# Patient Record
Sex: Male | Born: 1945 | Race: Black or African American | Hispanic: No | Marital: Married | Smoking: Former smoker
Health system: Southern US, Community
[De-identification: ages and names within clinical notes are randomized; demographics above are authoritative.]

## PROBLEM LIST (undated history)

## (undated) DIAGNOSIS — M1990 Unspecified osteoarthritis, unspecified site: Secondary | ICD-10-CM

## (undated) DIAGNOSIS — E119 Type 2 diabetes mellitus without complications: Secondary | ICD-10-CM

## (undated) DIAGNOSIS — I10 Essential (primary) hypertension: Secondary | ICD-10-CM

## (undated) DIAGNOSIS — C61 Malignant neoplasm of prostate: Secondary | ICD-10-CM

## (undated) DIAGNOSIS — G5601 Carpal tunnel syndrome, right upper limb: Secondary | ICD-10-CM

## (undated) HISTORY — PX: INCISION AND DRAINAGE DEEP NECK ABSCESS: SHX1797

---

## 2000-08-08 ENCOUNTER — Encounter: Payer: Self-pay | Admitting: Endocrinology

## 2000-08-08 ENCOUNTER — Encounter: Admission: RE | Admit: 2000-08-08 | Discharge: 2000-08-08 | Payer: Self-pay | Admitting: Endocrinology

## 2003-05-22 ENCOUNTER — Ambulatory Visit (HOSPITAL_COMMUNITY): Admission: RE | Admit: 2003-05-22 | Discharge: 2003-05-22 | Payer: Self-pay | Admitting: Internal Medicine

## 2003-05-22 ENCOUNTER — Encounter: Payer: Self-pay | Admitting: Internal Medicine

## 2005-04-20 ENCOUNTER — Emergency Department (HOSPITAL_COMMUNITY): Admission: EM | Admit: 2005-04-20 | Discharge: 2005-04-20 | Payer: Self-pay | Admitting: Family Medicine

## 2005-10-24 DIAGNOSIS — C61 Malignant neoplasm of prostate: Secondary | ICD-10-CM

## 2005-10-24 HISTORY — PX: INSERTION PROSTATE RADIATION SEED: SUR718

## 2005-10-24 HISTORY — DX: Malignant neoplasm of prostate: C61

## 2009-02-26 ENCOUNTER — Encounter: Payer: Self-pay | Admitting: Internal Medicine

## 2009-03-04 ENCOUNTER — Encounter: Payer: Self-pay | Admitting: Internal Medicine

## 2009-03-10 ENCOUNTER — Ambulatory Visit: Payer: Self-pay | Admitting: Internal Medicine

## 2009-03-10 DIAGNOSIS — I10 Essential (primary) hypertension: Secondary | ICD-10-CM | POA: Insufficient documentation

## 2009-03-10 DIAGNOSIS — E785 Hyperlipidemia, unspecified: Secondary | ICD-10-CM | POA: Insufficient documentation

## 2009-03-10 DIAGNOSIS — R0989 Other specified symptoms and signs involving the circulatory and respiratory systems: Secondary | ICD-10-CM

## 2009-03-10 DIAGNOSIS — J984 Other disorders of lung: Secondary | ICD-10-CM

## 2009-03-10 DIAGNOSIS — R0609 Other forms of dyspnea: Secondary | ICD-10-CM

## 2009-03-18 ENCOUNTER — Ambulatory Visit (HOSPITAL_COMMUNITY): Admission: RE | Admit: 2009-03-18 | Discharge: 2009-03-18 | Payer: Self-pay | Admitting: Internal Medicine

## 2009-03-18 ENCOUNTER — Ambulatory Visit: Payer: Self-pay | Admitting: Internal Medicine

## 2009-03-24 ENCOUNTER — Telehealth: Payer: Self-pay | Admitting: Internal Medicine

## 2009-03-24 DIAGNOSIS — J387 Other diseases of larynx: Secondary | ICD-10-CM

## 2009-03-25 ENCOUNTER — Encounter: Payer: Self-pay | Admitting: Internal Medicine

## 2009-03-25 ENCOUNTER — Telehealth: Payer: Self-pay | Admitting: Internal Medicine

## 2009-05-08 ENCOUNTER — Encounter: Payer: Self-pay | Admitting: Internal Medicine

## 2010-06-08 IMAGING — PT NM PET TUM IMG INITIAL (PI) SKULL BASE T - THIGH
1 of 7 series · 1 of 25 positions shown · non-contrast
Comparison: None

CLINICAL DATA: Initial treatment strategy for lung cancer.

NUCLEAR MEDICINE PET SKULL BASE TO THIGH
TECHNIQUE: 16.8 mCi F-18 FDG was injected intravenously via the
right hand.  Full-ring PET imaging was performed from the skull
base through the mid-thighs 110  minutes after injection.  CT data
was obtained and used for attenuation correction and anatomic
localization only.  (This was not acquired as a diagnostic CT
examination.)

[Series 2: ct images · axial · 3.8mm · 0.98mm/px · 1 of 267 slices shown]
[im 267/267  brain]
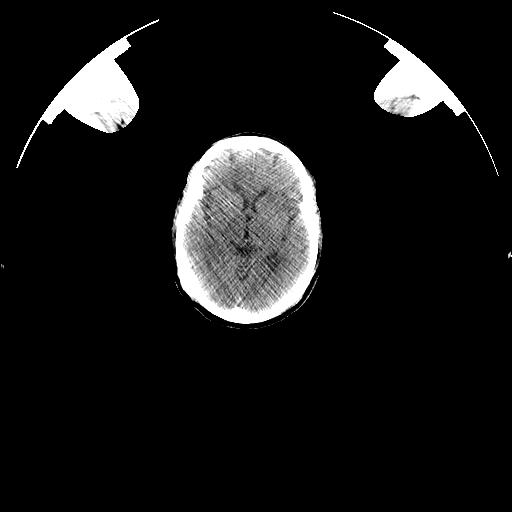

[1 of 25 positions shown; findings below may reference images not displayed]

FINDINGS: Neck: There there is symmetric hypermetabolic activity in the
posterior nasopharynx.  There is hypermetabolic activity in the
pharyngeal mucosa.  There is asymmetry in the left vallecular
region (image 47).  There is a single hypermetabolic 1.5 cm left
level II A node (image 33) with S U V max with SUV max = 7.8.

Chest: The 2  cm left lower lobe pulmonary mass has no associated
hypermetabolic activity consistent with benign etiology.  Within
the right lower lobe, there is a 4 mm nodule (image 106).  Airway
appears normal.  No evidence of hypermetabolic mediastinal nodes.

Abdomen / Pelvis: There is hypermetabolic activity associated with
left and right adrenal gland with SUV max = 3.7).  This activity is
only slightly above liver activity.  There is no adrenal lesion
identified on the CT portion.

No hypermetabolic nodes in the abdomen pelvis.

Skeleton: No focal hypermetabolic activity to suggest skeletal
metastasis.
IMPRESSION: 1.  No hypermetabolic activity associated with 2 cm round left
lower lobe pulmonary nodule consistent with  benign etiology.
2. Hypermetabolic left level II A node with asymmetry in the left
vallecular region.  Recommend ENT direct visualization of the
oropharynx/hypopharynx  to exclude head neck cancer.

3.  A 4 mm right lower lobe pulmonary nodule. If the patient is low
risk for carcinoma recommend follow-up noncontrast CT in 12
months. If high risk recommend follow-up in 6 to 12 months per
Fleischner criteria.

4.  Hypermetabolic adrenal glands likely represents hyperplasia.

This recommendation follows the consensus statement: "Guidelines
for Management of Small Pulmonary Nodules Detected on CT Scans:  A
Statement from the [HOSPITAL]" as published in Radiology
8887; [DATE].  Available online at:
[URL]

## 2010-11-15 ENCOUNTER — Encounter: Payer: Self-pay | Admitting: Otolaryngology

## 2010-11-15 ENCOUNTER — Encounter: Payer: Self-pay | Admitting: General Practice

## 2011-01-10 ENCOUNTER — Telehealth: Payer: Self-pay | Admitting: Internal Medicine

## 2011-01-20 NOTE — Progress Notes (Signed)
Summary: never bought his old xrays  ---- Converted from flag ---- ---- 05/14/2009 4:45 PM, Carron Curie CMA wrote:  I advised him of the need to make an appt and bring Xrays and he stated he will call to set that up and bring xrays at that time. As of today he has done neither.  ---- 05/14/2009 4:35 PM, Kalman Shan MD wrote: did he ever bring his old xrarys. when his next appt? ------------------------------

## 2011-01-20 NOTE — Progress Notes (Signed)
Summary: old xrays  ---- Converted from flag ---- ---- 04/01/2009 3:01 PM, Kalman Shan MD wrote: he has appt with me on 7/19. he needs to bring in old xrays with Dr. Lucianne Muss for my personal review if he haas not already done it ------------------------------

## 2011-02-01 LAB — GLUCOSE, CAPILLARY: Glucose-Capillary: 66 mg/dL — ABNORMAL LOW (ref 70–99)

## 2015-07-23 ENCOUNTER — Other Ambulatory Visit: Payer: Self-pay | Admitting: Orthopedic Surgery

## 2015-08-12 ENCOUNTER — Encounter (HOSPITAL_BASED_OUTPATIENT_CLINIC_OR_DEPARTMENT_OTHER): Payer: Self-pay | Admitting: *Deleted

## 2015-08-14 ENCOUNTER — Encounter (HOSPITAL_BASED_OUTPATIENT_CLINIC_OR_DEPARTMENT_OTHER)
Admission: RE | Admit: 2015-08-14 | Discharge: 2015-08-14 | Disposition: A | Discharge status: Home / Self Care | Payer: Self-pay | Source: Ambulatory Visit | Attending: Orthopedic Surgery | Admitting: Orthopedic Surgery

## 2015-08-14 ENCOUNTER — Other Ambulatory Visit: Payer: Self-pay

## 2015-08-14 DIAGNOSIS — G5601 Carpal tunnel syndrome, right upper limb: Secondary | ICD-10-CM | POA: Diagnosis present

## 2015-08-14 DIAGNOSIS — Z7984 Long term (current) use of oral hypoglycemic drugs: Secondary | ICD-10-CM | POA: Diagnosis not present

## 2015-08-14 DIAGNOSIS — E119 Type 2 diabetes mellitus without complications: Secondary | ICD-10-CM | POA: Diagnosis not present

## 2015-08-14 DIAGNOSIS — Z8546 Personal history of malignant neoplasm of prostate: Secondary | ICD-10-CM | POA: Diagnosis not present

## 2015-08-14 DIAGNOSIS — G5621 Lesion of ulnar nerve, right upper limb: Secondary | ICD-10-CM | POA: Diagnosis not present

## 2015-08-14 DIAGNOSIS — Z87891 Personal history of nicotine dependence: Secondary | ICD-10-CM | POA: Diagnosis not present

## 2015-08-14 DIAGNOSIS — I10 Essential (primary) hypertension: Secondary | ICD-10-CM | POA: Diagnosis not present

## 2015-08-14 LAB — BASIC METABOLIC PANEL
ANION GAP: 10 (ref 5–15)
BUN: 23 mg/dL — AB (ref 6–20)
CALCIUM: 9.7 mg/dL (ref 8.9–10.3)
CO2: 21 mmol/L — AB (ref 22–32)
CREATININE: 2.53 mg/dL — AB (ref 0.61–1.24)
Chloride: 111 mmol/L (ref 101–111)
GFR calc Af Amer: 28 mL/min — ABNORMAL LOW (ref >60)
GFR, EST NON AFRICAN AMERICAN: 24 mL/min — AB (ref >60)
GLUCOSE: 137 mg/dL — AB (ref 65–99)
Potassium: 5.6 mmol/L — ABNORMAL HIGH (ref 3.5–5.1)
Sodium: 142 mmol/L (ref 135–145)

## 2015-08-14 NOTE — Progress Notes (Signed)
BMET results shown to Dr Linna Caprice, repeat [facility].

## 2015-08-18 ENCOUNTER — Ambulatory Visit (HOSPITAL_BASED_OUTPATIENT_CLINIC_OR_DEPARTMENT_OTHER): Payer: Medicare Other | Admitting: Certified Registered"

## 2015-08-18 ENCOUNTER — Encounter (HOSPITAL_BASED_OUTPATIENT_CLINIC_OR_DEPARTMENT_OTHER): Payer: Self-pay | Admitting: Orthopedic Surgery

## 2015-08-18 ENCOUNTER — Encounter (HOSPITAL_BASED_OUTPATIENT_CLINIC_OR_DEPARTMENT_OTHER)
Admission: RE | Disposition: A | Discharge status: Home / Self Care | Payer: Self-pay | Source: Ambulatory Visit | Attending: Orthopedic Surgery

## 2015-08-18 ENCOUNTER — Ambulatory Visit (HOSPITAL_BASED_OUTPATIENT_CLINIC_OR_DEPARTMENT_OTHER)
Admission: RE | Admit: 2015-08-18 | Discharge: 2015-08-18 | Disposition: A | Discharge status: Home / Self Care | Payer: Medicare Other | Source: Ambulatory Visit | Attending: Orthopedic Surgery | Admitting: Orthopedic Surgery

## 2015-08-18 DIAGNOSIS — Z87891 Personal history of nicotine dependence: Secondary | ICD-10-CM | POA: Insufficient documentation

## 2015-08-18 DIAGNOSIS — I10 Essential (primary) hypertension: Secondary | ICD-10-CM | POA: Insufficient documentation

## 2015-08-18 DIAGNOSIS — Z7984 Long term (current) use of oral hypoglycemic drugs: Secondary | ICD-10-CM | POA: Insufficient documentation

## 2015-08-18 DIAGNOSIS — G5601 Carpal tunnel syndrome, right upper limb: Secondary | ICD-10-CM | POA: Insufficient documentation

## 2015-08-18 DIAGNOSIS — E119 Type 2 diabetes mellitus without complications: Secondary | ICD-10-CM | POA: Insufficient documentation

## 2015-08-18 DIAGNOSIS — G5621 Lesion of ulnar nerve, right upper limb: Secondary | ICD-10-CM | POA: Insufficient documentation

## 2015-08-18 DIAGNOSIS — Z8546 Personal history of malignant neoplasm of prostate: Secondary | ICD-10-CM | POA: Insufficient documentation

## 2015-08-18 HISTORY — DX: Unspecified osteoarthritis, unspecified site: M19.90

## 2015-08-18 HISTORY — PX: ULNAR NERVE TRANSPOSITION: SHX2595

## 2015-08-18 HISTORY — DX: Type 2 diabetes mellitus without complications: E11.9

## 2015-08-18 HISTORY — DX: Carpal tunnel syndrome, right upper limb: G56.01

## 2015-08-18 HISTORY — PX: CARPAL TUNNEL RELEASE: SHX101

## 2015-08-18 HISTORY — DX: Malignant neoplasm of prostate: C61

## 2015-08-18 HISTORY — DX: Essential (primary) hypertension: I10

## 2015-08-18 LAB — POCT I-STAT, CHEM 8
BUN: 34 mg/dL — ABNORMAL HIGH (ref 6–20)
CALCIUM ION: 1.23 mmol/L (ref 1.13–1.30)
CHLORIDE: 113 mmol/L — AB (ref 101–111)
Creatinine, Ser: 2.4 mg/dL — ABNORMAL HIGH (ref 0.61–1.24)
GLUCOSE: 146 mg/dL — AB (ref 65–99)
HCT: 33 % — ABNORMAL LOW (ref 39.0–52.0)
HEMOGLOBIN: 11.2 g/dL — AB (ref 13.0–17.0)
Potassium: 4.9 mmol/L (ref 3.5–5.1)
SODIUM: 141 mmol/L (ref 135–145)
TCO2: 19 mmol/L (ref 0–100)

## 2015-08-18 LAB — GLUCOSE, CAPILLARY: GLUCOSE-CAPILLARY: 133 mg/dL — AB (ref 65–99)

## 2015-08-18 SURGERY — CARPAL TUNNEL RELEASE
Anesthesia: Regional | Site: Wrist | Laterality: Right

## 2015-08-18 MED ORDER — PROPOFOL 500 MG/50ML IV EMUL
INTRAVENOUS | Status: DC | PRN
  Administered 2015-08-18: 35 ug/kg/min via INTRAVENOUS

## 2015-08-18 MED ORDER — GLYCOPYRROLATE 0.2 MG/ML IJ SOLN: 0.2000 mg | Freq: Once | INTRAMUSCULAR | Status: DC | PRN

## 2015-08-18 MED ORDER — LIDOCAINE HCL (CARDIAC) 20 MG/ML IV SOLN
INTRAVENOUS | Status: AC
  Filled 2015-08-18: qty 5

## 2015-08-18 MED ORDER — FENTANYL CITRATE (PF) 100 MCG/2ML IJ SOLN
50.0000 ug | INTRAMUSCULAR | Status: DC | PRN
  Administered 2015-08-18: 100 ug via INTRAVENOUS

## 2015-08-18 MED ORDER — BUPIVACAINE-EPINEPHRINE (PF) 0.5% -1:200000 IJ SOLN
INTRAMUSCULAR | Status: DC | PRN
  Administered 2015-08-18: 30 mL via PERINEURAL

## 2015-08-18 MED ORDER — MIDAZOLAM HCL 2 MG/2ML IJ SOLN
INTRAMUSCULAR | Status: AC
  Filled 2015-08-18: qty 2

## 2015-08-18 MED ORDER — LACTATED RINGERS IV SOLN
INTRAVENOUS | Status: DC
  Administered 2015-08-18: 09:00:00 via INTRAVENOUS

## 2015-08-18 MED ORDER — SCOPOLAMINE 1 MG/3DAYS TD PT72: 1.0000 | MEDICATED_PATCH | Freq: Once | TRANSDERMAL | Status: DC | PRN

## 2015-08-18 MED ORDER — ONDANSETRON HCL 4 MG/2ML IJ SOLN: 4.0000 mg | Freq: Once | INTRAMUSCULAR | Status: DC | PRN

## 2015-08-18 MED ORDER — CEFAZOLIN SODIUM-DEXTROSE 2-3 GM-% IV SOLR: 2.0000 g | INTRAVENOUS | Status: DC

## 2015-08-18 MED ORDER — ONDANSETRON HCL 4 MG/2ML IJ SOLN
INTRAMUSCULAR | Status: DC | PRN
  Administered 2015-08-18: 4 mg via INTRAVENOUS
  Administered 2015-08-18: 20 mg via INTRAVENOUS

## 2015-08-18 MED ORDER — MEPERIDINE HCL 25 MG/ML IJ SOLN: 6.2500 mg | INTRAMUSCULAR | Status: DC | PRN

## 2015-08-18 MED ORDER — FENTANYL CITRATE (PF) 100 MCG/2ML IJ SOLN
INTRAMUSCULAR | Status: AC
  Filled 2015-08-18: qty 2

## 2015-08-18 MED ORDER — ONDANSETRON HCL 4 MG/2ML IJ SOLN
INTRAMUSCULAR | Status: AC
  Filled 2015-08-18: qty 2

## 2015-08-18 MED ORDER — MIDAZOLAM HCL 2 MG/2ML IJ SOLN
1.0000 mg | INTRAMUSCULAR | Status: DC | PRN
  Administered 2015-08-18: 2 mg via INTRAVENOUS

## 2015-08-18 MED ORDER — HYDROCODONE-ACETAMINOPHEN 10-325 MG PO TABS: 1.0000 | ORAL_TABLET | Freq: Four times a day (QID) | ORAL | Status: DC | PRN

## 2015-08-18 MED ORDER — HYDROMORPHONE HCL 1 MG/ML IJ SOLN: 0.2500 mg | INTRAMUSCULAR | Status: DC | PRN

## 2015-08-18 MED ORDER — CHLORHEXIDINE GLUCONATE 4 % EX LIQD: 60.0000 mL | Freq: Once | CUTANEOUS | Status: DC

## 2015-08-18 MED ORDER — CEFAZOLIN SODIUM-DEXTROSE 2-3 GM-% IV SOLR
2.0000 g | INTRAVENOUS | Status: AC
  Administered 2015-08-18: 2 g via INTRAVENOUS

## 2015-08-18 MED ORDER — CEFAZOLIN SODIUM-DEXTROSE 2-3 GM-% IV SOLR
INTRAVENOUS | Status: AC
  Filled 2015-08-18: qty 50

## 2015-08-18 SURGICAL SUPPLY — 48 items
BLADE MINI RND TIP GREEN BEAV (BLADE) IMPLANT
BLADE SURG 15 STRL LF DISP TIS (BLADE) ×2 IMPLANT
BLADE SURG 15 STRL SS (BLADE) ×4
BNDG CMPR 9X4 STRL LF SNTH (GAUZE/BANDAGES/DRESSINGS) ×2
BNDG COHESIVE 3X5 TAN STRL LF (GAUZE/BANDAGES/DRESSINGS) ×8 IMPLANT
BNDG ESMARK 4X9 LF (GAUZE/BANDAGES/DRESSINGS) ×4 IMPLANT
BNDG GAUZE ELAST 4 BULKY (GAUZE/BANDAGES/DRESSINGS) ×4 IMPLANT
CHLORAPREP W/TINT 26ML (MISCELLANEOUS) ×4 IMPLANT
CORDS BIPOLAR (ELECTRODE) ×4 IMPLANT
COVER BACK TABLE 60X90IN (DRAPES) ×4 IMPLANT
COVER MAYO STAND STRL (DRAPES) ×4 IMPLANT
CUFF TOURN SGL LL 18 NRW (TOURNIQUET CUFF) ×4 IMPLANT
CUFF TOURNIQUET SINGLE 18IN (TOURNIQUET CUFF) ×4 IMPLANT
DECANTER SPIKE VIAL GLASS SM (MISCELLANEOUS) IMPLANT
DRAPE EXTREMITY T 121X128X90 (DRAPE) ×4 IMPLANT
DRAPE SURG 17X23 STRL (DRAPES) ×4 IMPLANT
DRSG PAD ABDOMINAL 8X10 ST (GAUZE/BANDAGES/DRESSINGS) ×4 IMPLANT
GAUZE SPONGE 4X4 12PLY STRL (GAUZE/BANDAGES/DRESSINGS) ×4 IMPLANT
GAUZE SPONGE 4X4 16PLY XRAY LF (GAUZE/BANDAGES/DRESSINGS) IMPLANT
GAUZE XEROFORM 1X8 LF (GAUZE/BANDAGES/DRESSINGS) ×4 IMPLANT
GLOVE BIOGEL PI IND STRL 8.5 (GLOVE) ×2 IMPLANT
GLOVE BIOGEL PI INDICATOR 8.5 (GLOVE) ×2
GLOVE SURG ORTHO 8.0 STRL STRW (GLOVE) ×4 IMPLANT
GOWN STRL REUS W/ TWL LRG LVL3 (GOWN DISPOSABLE) ×2 IMPLANT
GOWN STRL REUS W/TWL LRG LVL3 (GOWN DISPOSABLE) ×4
GOWN STRL REUS W/TWL XL LVL3 (GOWN DISPOSABLE) ×4 IMPLANT
LOOP VESSEL MAXI BLUE (MISCELLANEOUS) IMPLANT
NDL PRECISIONGLIDE 27X1.5 (NEEDLE) IMPLANT
NEEDLE PRECISIONGLIDE 27X1.5 (NEEDLE) IMPLANT
NS IRRIG 1000ML POUR BTL (IV SOLUTION) ×4 IMPLANT
PACK BASIN DAY SURGERY FS (CUSTOM PROCEDURE TRAY) ×4 IMPLANT
PAD CAST 3X4 CTTN HI CHSV (CAST SUPPLIES) IMPLANT
PAD CAST 4YDX4 CTTN HI CHSV (CAST SUPPLIES) ×2 IMPLANT
PADDING CAST COTTON 3X4 STRL (CAST SUPPLIES)
PADDING CAST COTTON 4X4 STRL (CAST SUPPLIES) ×4
SLEEVE SCD COMPRESS KNEE MED (MISCELLANEOUS) ×4 IMPLANT
SPLINT PLASTER CAST XFAST 3X15 (CAST SUPPLIES) IMPLANT
SPLINT PLASTER XTRA FASTSET 3X (CAST SUPPLIES)
STOCKINETTE 4X48 STRL (DRAPES) ×4 IMPLANT
SUT ETHILON 4 0 PS 2 18 (SUTURE) ×4 IMPLANT
SUT NYLON 9 0 VRM6 (SUTURE) ×2 IMPLANT
SUT VIC AB 2-0 SH 27 (SUTURE) ×4
SUT VIC AB 2-0 SH 27XBRD (SUTURE) ×2 IMPLANT
SUT VICRYL 4-0 PS2 18IN ABS (SUTURE) ×4 IMPLANT
SYR BULB 3OZ (MISCELLANEOUS) ×4 IMPLANT
SYR CONTROL 10ML LL (SYRINGE) IMPLANT
TOWEL OR 17X24 6PK STRL BLUE (TOWEL DISPOSABLE) ×4 IMPLANT
UNDERPAD 30X30 (UNDERPADS AND DIAPERS) ×4 IMPLANT

## 2015-08-18 NOTE — Discharge Instructions (Addendum)

## 2015-08-18 NOTE — Progress Notes (Signed)
Assisted Dr. Ossey with right, ultrasound guided, interscalene  block. Side rails up, monitors on throughout procedure. See vital signs in flow sheet. Tolerated Procedure well. 

## 2015-08-18 NOTE — Brief Op Note (Signed)
08/18/2015  11:16 AM  PATIENT:  Thedora Hinders  69 y.o. 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  POST-OPERATIVE DIAGNOSIS:  Carpal tunnel syndrome Cubital Tunnel Right G56.21, Guy[place] G50.21  PROCEDURE:  Procedure(s) with comments: RIGHT CARPAL TUNNEL RELEASE (Right) - block in preop  DECOMPRESSION ULNAR NERVE RIGHT WRIST WITH DEBRIDEMENT ULNAR NERVE (Right) - block in preop  SURGEON:  Surgeon(s) and Role:    * Daryll Brod, MD - Primary  PHYSICIAN ASSISTANT:   ASSISTANTS: none   ANESTHESIA:   regional and general  EBL:  Total I/O In: 600 [I.V.:600] Out: -   BLOOD ADMINISTERED:none  DRAINS: none   LOCAL MEDICATIONS USED:  NONE  SPECIMEN:  No Specimen  DISPOSITION OF SPECIMEN:  N/A  COUNTS:  YES  TOURNIQUET:   Total Tourniquet Time Documented: Upper Arm (Right) - 48 minutes Total: Upper Arm (Right) - 48 minutes   DICTATION: .Other Dictation: Dictation Number [id number]  PLAN OF CARE: Discharge to home after PACU  PATIENT DISPOSITION:  PACU - hemodynamically stable.

## 2015-08-18 NOTE — Op Note (Signed)
Dictated [id number]

## 2015-08-18 NOTE — Anesthesia Procedure Notes (Addendum)
Procedure Name: MAC Date/Time: 08/18/2015 9:55 AM Performed by: BLOCKER, TIMOTHY D Pre-anesthesia Checklist: Patient identified, Emergency Drugs available, Suction available, Patient being monitored and Timeout performed Patient Re-evaluated:Patient Re-evaluated prior to inductionOxygen Delivery Method: Simple face mask   Anesthesia Regional Block:  Supraclavicular block  Pre-Anesthetic Checklist: ,, timeout performed, Correct Patient, Correct Site, Correct Laterality, Correct Procedure, Correct Position, site marked, Risks and benefits discussed,  Surgical consent,  Pre-op evaluation,  At surgeon's request and post-op pain management  Laterality: Right  Prep: chloraprep       Needles:   Needle Type: Echogenic Stimulator Needle     Needle Length: 9cm 9 cm Needle Gauge: 21 and 21 G    Additional Needles:  Procedures: ultrasound guided (picture in chart) and nerve stimulator Supraclavicular block  Nerve Stimulator or Paresthesia:  Response: 0.4 mA,   Additional Responses:   Narrative:  Start time: 08/18/2015 9:35 AM End time: 08/18/2015 9:45 AM Injection made incrementally with aspirations every 5 mL.  Performed by: Personally  Anesthesiologist: Lillia Abed  Additional Notes: Monitors applied. Patient sedated. Sterile prep and drape,hand hygiene and sterile gloves were used. Relevant anatomy identified.Needle position confirmed.Local anesthetic injected incrementally after negative aspiration. Local anesthetic spread visualized around nerve(s). Vascular puncture avoided. No complications. Image printed for medical record.The patient tolerated the procedure well.

## 2015-08-18 NOTE — Transfer of Care (Signed)
Immediate Anesthesia Transfer of Care Note  Patient: SHARIQ PUIG  Procedure(s) Performed: Procedure(s) with comments: RIGHT CARPAL TUNNEL RELEASE (Right) - block in preop  DECOMPRESSION ULNAR NERVE RIGHT WRIST WITH DEBRIDEMENT ULNAR NERVE (Right) - block in preop  Patient Location: PACU  Anesthesia Type:MAC combined with regional for post-op pain  Level of Consciousness: awake, sedated and patient cooperative  Airway & Oxygen Therapy: Patient Spontanous Breathing and Patient connected to face mask oxygen  Post-op Assessment: Report given to RN and Post -op Vital signs reviewed and stable  Post vital signs: Reviewed and stable  Last Vitals:  Filed Vitals:   08/18/15 0952  BP:   Pulse: 68  Temp:   Resp: 16    Complications: No apparent anesthesia complications

## 2015-08-18 NOTE — Anesthesia Preprocedure Evaluation (Signed)
Anesthesia Evaluation  Patient identified by MRN, date of birth, ID band Patient awake    Reviewed: Allergy & Precautions, NPO status , Patient's Chart, lab work & pertinent test results  Airway Mallampati: I  TM Distance: >3 FB Neck ROM: Full    Dental   Pulmonary former smoker,    Pulmonary exam normal        Cardiovascular hypertension, Pt. on medications Normal cardiovascular exam     Neuro/Psych    GI/Hepatic   Endo/Other  diabetes, Type 2, Oral Hypoglycemic Agents  Renal/GU      Musculoskeletal   Abdominal   Peds  Hematology   Anesthesia Other Findings   Reproductive/Obstetrics                             Anesthesia Physical Anesthesia Plan  ASA: II  Anesthesia Plan: Regional   Post-op Pain Management:    Induction: Intravenous  Airway Management Planned: Simple Face Mask  Additional Equipment:   Intra-op Plan:   Post-operative Plan:   Informed Consent: I have reviewed the patients History and Physical, chart, labs and discussed the procedure including the risks, benefits and alternatives for the proposed anesthesia with the patient or authorized representative who has indicated his/her understanding and acceptance.     Plan Discussed with: CRNA and Surgeon  Anesthesia Plan Comments:         Anesthesia Quick Evaluation

## 2015-08-18 NOTE — Anesthesia Postprocedure Evaluation (Signed)
Anesthesia Post Note  Patient: Ernest Walker  Procedure(s) Performed: Procedure(s) (LRB): RIGHT CARPAL TUNNEL RELEASE (Right)  DECOMPRESSION ULNAR NERVE RIGHT WRIST WITH DEBRIDEMENT ULNAR NERVE (Right)  Anesthesia type: regional  Patient location: PACU  Post pain: Pain level controlled  Post assessment: Patient's Cardiovascular Status Stable  Last Vitals:  Filed Vitals:   08/18/15 1155  BP: 113/46  Pulse: 63  Temp: 36.6 C  Resp: 16    Post vital signs: Reviewed and stable  Level of consciousness: awake  Complications: No apparent anesthesia complications

## 2015-08-18 NOTE — H&P (Signed)
Ernest Walker is a 69 year old right hand dominant male who comes in complaining of pain in his right hand and wrist for the past 3 weeks with increased swelling, numbness and tingling to his fingers. He has no history of injury. There is no history of thyroid problems, arthritis or gout. He states he does have diabetes. He has been trying an occasional Tylenol but has taken no other medicines. He states this will frequently awaken him at night. He complains of a constant feeling of swelling and numbness to the thumb, index and middle fingers. He states it is getting worse. Activity and work makes it worse and rest makes it better. 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  He has changes of decreased amplitude and upper limit of normal to the ulnar nerves at his wrist along with carpal tunnel syndrome.  His motor delay is 9.1/right and 4.8/left, sensory delay 3.0/left and 3.5/right, amplitude diminution 14.3/left and 6.3/right.  He has conduction velocity diminution with increased amplitude at his ulnar nerve at his elbow of 46/left and 44/right.  PAST MEDICAL HISTORY:  He has no known drug allergies. He does take medications but does not know what they are. He says the PCP will fax a list but he is treated at the [place] and does not have a PCP here. He has a history of prostate cancer with surgery on this.  FAMILY MEDICAL HISTORY: Negative.  SOCIAL HISTORY:  He does not smoke or use alcohol.   REVIEW OF SYSTEMS: Positive for glasses, high BP, otherwise negative 14 points. Ernest Walker is an 69 y.o. male.   Chief Complaint: numbness right arm HPI: see above  Past Medical History  Diagnosis Date  . Hypertension   . Diabetes mellitus without complication ([place])   . Arthritis     hands, knees  . Prostate cancer [facility]) 2007    radiation implant done at [facility] in [place]  . Carpal tunnel syndrome of right  wrist     Past Surgical History  Procedure Laterality Date  . Incision and drainage deep neck abscess    . Insertion prostate radiation seed  2007    at [facility] in [place]    History reviewed. No pertinent family history. Social History:  reports that he has quit smoking. He does not have any smokeless tobacco history on file. He reports that he does not drink alcohol or use illicit drugs.  Allergies: No Known Allergies  No prescriptions prior to admission    No results found for this or any previous visit (from the past 48 hour(s)).  No results found.   Pertinent items are noted in HPI.  Height 5\' 10"  (1.778 m), weight 103.874 kg (229 lb).  General appearance: alert, cooperative and appears stated age Head: Normocephalic, without obvious abnormality Neck: no JVD Resp: clear to auscultation bilaterally Cardio: regular rate and rhythm, S1, S2 normal, no murmur, click, rub or gallop GI: soft, non-tender; bowel sounds normal; no masses,  no organomegaly Extremities: numbness right arm Pulses: 2+ and symmetric Skin: Skin color, texture, turgor normal. No rashes or lesions Neurologic: Grossly normal Incision/Wound: na  Assessment/Plan X-rays of his hand reveals degenerative changes at the PIP and DIP joints, CMC joints right side.  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    The pre, peri and postoperative course were discussed along with the risks and complications.  The patient is aware there is no guarantee with the surgery, possibility of infection, recurrence, injury to arteries, nerves, tendons, incomplete relief of symptoms and dystrophy.  He would like to proceed.  He is scheduled for carpal  tunnel release right wrist, decompression and possible transposition right ulnar nerve at the elbow, decompression of Guyon's canal on the right as an outpatient under regional anesthesia.  Latash Nouri R 08/18/2015, 6:24 AM

## 2015-08-19 ENCOUNTER — Encounter (HOSPITAL_BASED_OUTPATIENT_CLINIC_OR_DEPARTMENT_OTHER): Payer: Self-pay | Admitting: Orthopedic Surgery

## 2015-08-19 NOTE — Op Note (Signed)
NAME:  Ernest Walker, Ernest Walker NO.:  [id number]  MEDICAL RECORD NO.:  [id number]  LOCATION:                                 FACILITY:  PHYSICIAN:  Daryll Brod, M.D.            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  SURGEON:  Daryll Brod, MD.  ANESTHESIA:  Supraclavicular block general.  ANESTHESIOLOGIST:  Crissie Sickles. Conrad Mart, M.D.  HISTORY:  The patient is a 68 year old male with a history of numbness and tingling of his right arm.  Nerve conductions are positive for neuropathy, compression in nature to the median and ulnar nerve at his wrist.  The ulnar nerve at his elbow with positive nerve conductions not responded to conservative treatment.  He is also diabetic.  He has elected to undergo surgical decompression to the median, ulnar nerve at his wrist and the ulnar nerve at his elbow with possible transposition. Pre, peri, and postoperative courses have been discussed along with risks and complications.  He is aware that there is no guarantee with the surgery; possibility of infection; recurrence of injury to arteries, nerves, tendons; incomplete relief of symptoms; dystrophy.  In the preoperative area, the patient is seen, the extremity marked by both patient and surgeon.  Antibiotic given.  PROCEDURE IN DETAIL:  The patient was brought to the operating room, where a general  anesthetic was carried out under the direction of Dr. Conrad [place].  A supraclavicular block was carried out in the preoperative area.  Prior to bringing him to the operating room.  He was prepped using ChloraPrep, supine position with the right arm free.  A 3-minute dry time was allowed.  Time-out taken, confirming the patient and procedure.  The limb was exsanguinated with an Esmarch bandage. Tourniquet placed high on the arm was inflated to 250 mmHg.  A longitudinal incision was made in the right palm, carried down through subcutaneous tissue.  Bleeders were electrocauterized with bipolar.  The palmar fascia was split.  Superficial palmar arch identified.  Flexor tendon of the ring and little finger identified.  An incision was then made in the carpal retinaculum to the ulnar side of the median nerve. During this dissection, it appeared that a nerve was present within the flexor retinaculum, this was cut.  The dissection was carried proximally.  A right angle and Sewall retractors were placed between skin and forearm fascia.  The fascia was released for approximately 1.5 to 2 cm proximal to the wrist crease.  Guyon's canal was then released with sharp dissection.  A very proximal takeoff of 1 fascicle from the ulnar nerve was  noted, the portion transected which entered into the flexor retinaculum.  This was then tagged as was the branch on the radial side in the flexor retinaculum. The motor branch was noted to enter from the radial side of the median nerve distally and entered into the muscle. This was intact.  The wound was irrigated and packed.  A separate incision was then made in the medial epicondyle of the elbow, carried down through subcutaneous tissue.  Bleeders again electrocauterized.  Dissection carried down just posterior to the epicondyle.  A large portion of the triceps was present an anconeous muscle was not.  Osborne fascia was released on its posterior aspect.  A right angle  retractor was placed distally. A knee retractor was then placed after dissecting the subcutaneous tissue from the underlying forearm fascia. A KMI guide for carpal tunnel release was then passed between the ulnar nerve, the deep fascia after splitting the flexor carpi ulnaris into its 2 heads.  This allowed retraction of the ulnar nerve distally.  A right- angle ENT scissors were then used to cut the deep fascia taking care to preserve and protect the ulnar nerve distally.  This was done for approximately 7 cm distally.  Attention was then directed proximally. The brachial fascia was then separated from the overlying skin and subcutaneous tissue.  The new retractors again placed.  The [facility] guide was placed between the ulnar nerve proximally in the fascia.  The fascia was then released proximally for about approximately 6 to 7 cm.  The elbow was flexed.  No subluxation dislocation to the ulnar nerve was present.  The 2 wounds were irrigated with saline.  The subcutaneous tissue, deep layers were closed with interrupted 2-0 Vicryl sutures, and the skin with interrupted 4-0 nylon sutures.  The operative microscope was brought into position distally.  The small branch from the ulnar nerve was identified along with the radial portion which entered into the flexor retinaculum radially.  This was then repaired with a single interrupted 9-0 nylon suture.  Wound was irrigated.  The skin then closed with interrupted 4-0 nylon sutures.  Sterile compressive dressing.  No splint was applied.  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  The patient tolerated the procedure well and was taken to the recovery room for observation in satisfactory condition. He will be discharged home to return to the [facility] in 1 week on [place].  We will notify his wife the nerve branch  repair.          ______________________________ Daryll Brod, M.D.     GK/MEDQ  D:  08/18/2015  T:  08/19/2015  Job:  [id number]

## 2015-09-14 DIAGNOSIS — R531 Weakness: Secondary | ICD-10-CM | POA: Insufficient documentation

## 2015-09-14 DIAGNOSIS — R208 Other disturbances of skin sensation: Secondary | ICD-10-CM | POA: Insufficient documentation

## 2015-09-14 DIAGNOSIS — M25639 Stiffness of unspecified wrist, not elsewhere classified: Secondary | ICD-10-CM | POA: Insufficient documentation

## 2015-09-25 DIAGNOSIS — E119 Type 2 diabetes mellitus without complications: Secondary | ICD-10-CM | POA: Insufficient documentation

## 2019-01-14 ENCOUNTER — Ambulatory Visit: Payer: Non-veteran care | Admitting: Podiatrist

## 2019-02-20 ENCOUNTER — Telehealth: Payer: Self-pay | Admitting: *Deleted

## 2019-02-20 NOTE — Telephone Encounter (Signed)
Pt states he knows he has an appt either the 3rd or 4th of May, and is returning a missed call.

## 2019-02-25 ENCOUNTER — Ambulatory Visit: Payer: Non-veteran care | Admitting: Podiatrist

## 2019-02-26 ENCOUNTER — Ambulatory Visit: Payer: Non-veteran care | Admitting: Podiatry

## 2019-04-01 ENCOUNTER — Other Ambulatory Visit: Payer: Self-pay

## 2019-04-01 ENCOUNTER — Encounter: Payer: Self-pay | Admitting: Podiatry

## 2019-04-01 ENCOUNTER — Ambulatory Visit (INDEPENDENT_AMBULATORY_CARE_PROVIDER_SITE_OTHER): Payer: No Typology Code available for payment source | Admitting: Podiatry

## 2019-04-01 VITALS — Temp 98.2°F

## 2019-04-01 DIAGNOSIS — E1149 Type 2 diabetes mellitus with other diabetic neurological complication: Secondary | ICD-10-CM | POA: Diagnosis not present

## 2019-04-01 DIAGNOSIS — M79675 Pain in left toe(s): Secondary | ICD-10-CM | POA: Diagnosis not present

## 2019-04-01 DIAGNOSIS — B351 Tinea unguium: Secondary | ICD-10-CM | POA: Diagnosis not present

## 2019-04-01 DIAGNOSIS — G5601 Carpal tunnel syndrome, right upper limb: Secondary | ICD-10-CM | POA: Insufficient documentation

## 2019-04-01 DIAGNOSIS — M79674 Pain in right toe(s): Secondary | ICD-10-CM | POA: Diagnosis not present

## 2019-04-01 DIAGNOSIS — G5621 Lesion of ulnar nerve, right upper limb: Secondary | ICD-10-CM | POA: Insufficient documentation

## 2019-04-01 NOTE — Progress Notes (Signed)
Subjective:   Patient ID: Ernest Walker, male   DOB: 73 y.o.   MRN: [id number]   HPI 73 year old male presents the office today for concerns of nail fungus to both Very thick and discolored.  He states they have been giving is wearing compression stockings.  No recent treatment.  They do cause discomfort with shoes at times. No redness or drainage or signs of action.   Review of Systems  All other systems reviewed and are negative.  Past Medical History:  Diagnosis Date  . Arthritis    hands, knees  . Carpal tunnel syndrome of right wrist   . Diabetes mellitus without complication ([place])   . Hypertension   . Prostate cancer [facility]) 2007   radiation implant done at [facility] in [facility]    Past Surgical History:  Procedure Laterality Date  . CARPAL TUNNEL RELEASE Right 08/18/2015   Procedure: RIGHT CARPAL TUNNEL RELEASE;  Surgeon: Daryll Brod, MD;  Location: [facility];  Service: Orthopedics;  Laterality: Right;  block in preop  . INCISION AND DRAINAGE DEEP NECK ABSCESS    . INSERTION PROSTATE RADIATION SEED  2007   at [place] in [place] Right 08/18/2015   Procedure:  DECOMPRESSION ULNAR NERVE RIGHT WRIST WITH DEBRIDEMENT ULNAR NERVE;  Surgeon: Daryll Brod, MD;  Location: [facility];  Service: Orthopedics;  Laterality: Right;  block in preop     Current Outpatient Medications:  .  INSULIN ASPART FLEXPEN [place], Inject into the skin., Disp: , Rfl:  .  pantoprazole (PROTONIX) 40 MG tablet, Take 40 mg by mouth daily., Disp: , Rfl:  .  sennosides-docusate sodium (SENOKOT-S) 8.6-50 MG tablet, Take 1 tablet by mouth daily., Disp: , Rfl:  .  amLODipine (NORVASC) 10 MG tablet, Take 10 mg by mouth daily., Disp: , Rfl:  .  aspirin 81 MG tablet, Take 81 mg by mouth daily., Disp: , Rfl:  .  atorvastatin (LIPITOR) 20 MG tablet, Take 20 mg by mouth daily., Disp: , Rfl:  .  gabapentin (NEURONTIN) 300 MG capsule, Take 300 mg by mouth at bedtime., Disp: ,  Rfl:  .  lisinopril (ZESTRIL) 40 MG tablet, Take by mouth., Disp: , Rfl:  .  metoprolol (LOPRESSOR) 50 MG tablet, Take 50 mg by mouth 2 (two) times daily., Disp: , Rfl:  .  terazosin (HYTRIN) 10 MG capsule, Take 10 mg by mouth at bedtime., Disp: , Rfl:   No Known Allergies        Objective:  Physical Exam  General: AAO x3, NAD  Dermatological: Nails are hypertrophic, dystrophic, brittle, discolored, elongated 10.  There is a hallux toenails are significantly.  No surrounding redness or drainage. Tenderness nails 1-5 bilaterally. No open lesions or pre-ulcerative lesions are identified today.  Vascular: Dorsalis Pedis artery and Posterior Tibial artery pedal pulses are 2/4 bilateral with immedate capillary fill time. There is no pain with calf compression, swelling, warmth, erythema.   Neruologic: Grossly intact via light touch bilateral.  Sensation mild decrease was Weinstein monofilament.  Musculoskeletal: No gross boney pedal deformities bilateral. No pain, crepitus, or limitation noted with foot and ankle range of motion bilateral. Muscular strength 5/5 in all groups tested bilateral.     Assessment:   Symptomatic onychomycosis     Plan:  -Treatment options discussed including all alternatives, risks, and complications -Etiology of symptoms were discussed -Nails debrided 10 without complications or bleeding.  He does not proceed with treatment.  I ordered  a compound cream today through [employer].  We discussed nail removal of the hallux toenails but given him being diabetic and not active and mildly discomfort currently off on removal at this time. -Daily foot inspection -Follow-up in 3 months or sooner if any problems arise. In the meantime, encouraged to call the office with any questions, concerns, change in symptoms.   Celesta Gentile, DPM

## 2019-07-08 ENCOUNTER — Other Ambulatory Visit: Payer: Self-pay

## 2019-07-08 ENCOUNTER — Ambulatory Visit (INDEPENDENT_AMBULATORY_CARE_PROVIDER_SITE_OTHER): Payer: Medicare Other | Admitting: Podiatry

## 2019-07-08 DIAGNOSIS — M79674 Pain in right toe(s): Secondary | ICD-10-CM

## 2019-07-08 DIAGNOSIS — E119 Type 2 diabetes mellitus without complications: Secondary | ICD-10-CM

## 2019-07-08 DIAGNOSIS — B351 Tinea unguium: Secondary | ICD-10-CM

## 2019-07-08 DIAGNOSIS — M79675 Pain in left toe(s): Secondary | ICD-10-CM

## 2019-07-08 DIAGNOSIS — E1149 Type 2 diabetes mellitus with other diabetic neurological complication: Secondary | ICD-10-CM | POA: Diagnosis not present

## 2019-07-08 NOTE — Patient Instructions (Signed)
Diabetes Mellitus and Foot Care Foot care is an important part of your health, especially when you have diabetes. Diabetes may cause you to have problems because of poor blood flow (circulation) to your feet and legs, which can cause your skin to:  Become thinner and drier.  Break more easily.  Heal more slowly.  Peel and crack. You may also have nerve damage (neuropathy) in your legs and feet, causing decreased feeling in them. This means that you may not notice minor injuries to your feet that could lead to more serious problems. Noticing and addressing any potential problems early is the best way to prevent future foot problems. How to care for your feet Foot hygiene  Wash your feet daily with warm water and mild soap. Do not use hot water. Then, pat your feet and the areas between your toes until they are completely dry. Do not soak your feet as this can dry your skin.  Trim your toenails straight across. Do not dig under them or around the cuticle. File the edges of your nails with an emery board or nail file.  Apply a moisturizing lotion or petroleum jelly to the skin on your feet and to dry, brittle toenails. Use lotion that does not contain alcohol and is unscented. Do not apply lotion between your toes. Shoes and socks  Wear clean socks or stockings every day. Make sure they are not too tight. Do not wear knee-high stockings since they may decrease blood flow to your legs.  Wear shoes that fit properly and have enough cushioning. Always look in your shoes before you put them on to be sure there are no objects inside.  To break in new shoes, wear them for just a few hours a day. This prevents injuries on your feet. Wounds, scrapes, corns, and calluses  Check your feet daily for blisters, cuts, bruises, sores, and redness. If you cannot see the bottom of your feet, use a mirror or ask someone for help.  Do not cut corns or calluses or try to remove them with medicine.  If you  find a minor scrape, cut, or break in the skin on your feet, keep it and the skin around it clean and dry. You may clean these areas with mild soap and water. Do not clean the area with peroxide, alcohol, or iodine.  If you have a wound, scrape, corn, or callus on your foot, look at it several times a day to make sure it is healing and not infected. Check for: ? Redness, swelling, or pain. ? Fluid or blood. ? Warmth. ? Pus or a bad smell. General instructions  Do not cross your legs. This may decrease blood flow to your feet.  Do not use heating pads or hot water bottles on your feet. They may burn your skin. If you have lost feeling in your feet or legs, you may not know this is happening until it is too late.  Protect your feet from hot and cold by wearing shoes, such as at the beach or on hot pavement.  Schedule a complete foot exam at least once a year (annually) or more often if you have foot problems. If you have foot problems, report any cuts, sores, or bruises to your health care provider immediately. Contact a health care provider if:  You have a medical condition that increases your risk of infection and you have any cuts, sores, or bruises on your feet.  You have an injury that is not   healing.  You have redness on your legs or feet.  You feel burning or tingling in your legs or feet.  You have pain or cramps in your legs and feet.  Your legs or feet are numb.  Your feet always feel cold.  You have pain around a toenail. Get help right away if:  You have a wound, scrape, corn, or callus on your foot and: ? You have pain, swelling, or redness that gets worse. ? You have fluid or blood coming from the wound, scrape, corn, or callus. ? Your wound, scrape, corn, or callus feels warm to the touch. ? You have pus or a bad smell coming from the wound, scrape, corn, or callus. ? You have a fever. ? You have a red line going up your leg. Summary  Check your feet every day  for cuts, sores, red spots, swelling, and blisters.  Moisturize feet and legs daily.  Wear shoes that fit properly and have enough cushioning.  If you have foot problems, report any cuts, sores, or bruises to your health care provider immediately.  Schedule a complete foot exam at least once a year (annually) or more often if you have foot problems. This information is not intended to replace advice given to you by your health care provider. Make sure you discuss any questions you have with your health care provider. Document Released: 10/07/2000 Document Revised: 11/22/2017 Document Reviewed: 11/11/2016 Elsevier Patient Education  2020 Elsevier Inc.   Onychomycosis/Fungal Toenails  WHAT IS IT? An infection that lies within the keratin of your nail plate that is caused by a fungus.  WHY ME? Fungal infections affect all ages, sexes, races, and creeds.  There may be many factors that predispose you to a fungal infection such as age, coexisting medical conditions such as diabetes, or an autoimmune disease; stress, medications, fatigue, genetics, etc.  Bottom line: fungus thrives in a warm, moist environment and your shoes offer such a location.  IS IT CONTAGIOUS? Theoretically, yes.  You do not want to share shoes, nail clippers or files with someone who has fungal toenails.  Walking around barefoot in the same room or sleeping in the same bed is unlikely to transfer the organism.  It is important to realize, however, that fungus can spread easily from one nail to the next on the same foot.  HOW DO WE TREAT THIS?  There are several ways to treat this condition.  Treatment may depend on many factors such as age, medications, pregnancy, liver and kidney conditions, etc.  It is best to ask your doctor which options are available to you.  1. No treatment.   Unlike many other medical concerns, you can live with this condition.  However for many people this can be a painful condition and may lead to  ingrown toenails or a bacterial infection.  It is recommended that you keep the nails cut short to help reduce the amount of fungal nail. 2. Topical treatment.  These range from herbal remedies to prescription strength nail lacquers.  About 40-50% effective, topicals require twice daily application for approximately 9 to 12 months or until an entirely new nail has grown out.  The most effective topicals are medical grade medications available through physicians offices. 3. Oral antifungal medications.  With an 80-90% cure rate, the most common oral medication requires 3 to 4 months of therapy and stays in your system for a year as the new nail grows out.  Oral antifungal medications do require   blood work to make sure it is a safe drug for you.  A liver function panel will be performed prior to starting the medication and after the first month of treatment.  It is important to have the blood work performed to avoid any harmful side effects.  In general, this medication safe but blood work is required. 4. Laser Therapy.  This treatment is performed by applying a specialized laser to the affected nail plate.  This therapy is noninvasive, fast, and non-painful.  It is not covered by insurance and is therefore, out of pocket.  The results have been very good with a 80-95% cure rate.  The [facility] is the only practice in the area to offer this therapy. 5. Permanent Nail Avulsion.  Removing the entire nail so that a new nail will not grow back. 

## 2019-07-09 ENCOUNTER — Encounter: Payer: Self-pay | Admitting: Podiatry

## 2019-07-09 NOTE — Progress Notes (Signed)
Subjective: Ernest Walker is seen today for follow up painful, elongated, thickened toenails 1-5 b/l feet that he cannot cut. Pain interferes with daily activities. Aggravating factor includes wearing enclosed shoe gear and relieved with periodic debridement.  Objective:  Neurovascular status unchanged: CFT<3 seconds x 10 digits.  Palpable pedal pulses b/l LE.    No digital hair b/l.   No edema b/l LE.   Protective sensation decreased with 10 gram monofilament b/l.  Vibratory sensation intact b/l.  Dermatological Examination: Skin with normal turgor, texture and tone b/l.  Toenails 1-5 b/l discolored, thick, dystrophic with subungual debris and pain with palpation to nailbeds due to thickness of nails.  Musculoskeletal: Muscle strength 5/5 to all LE muscle groups.  HAV with bunion b/l; hammertoes 2-5 b/l.  No pain, crepitus or joint limitation noted with ROM.   Assessment: Painful onychomycosis toenails 1-5 b/l  NIDDM Encounter for diabetic foot examination  Plan: 1. Patient diabetic foot examination  on today. 2. Toenails 1-5 b/l were debrided in length and girth without iatrogenic bleeding. 3. Patient to continue soft, supportive shoe gear 4. Patient to report any pedal injuries to medical professional immediately. 5. Follow up 3 months.  6. Patient/POA to call should there be a concern in the interim.

## 2019-10-07 ENCOUNTER — Other Ambulatory Visit: Payer: Self-pay

## 2019-10-07 ENCOUNTER — Ambulatory Visit: Payer: Non-veteran care | Admitting: Podiatry

## 2020-04-07 ENCOUNTER — Ambulatory Visit (INDEPENDENT_AMBULATORY_CARE_PROVIDER_SITE_OTHER): Payer: Non-veteran care | Admitting: Podiatry

## 2020-04-07 ENCOUNTER — Encounter: Payer: Self-pay | Admitting: Podiatry

## 2020-04-07 ENCOUNTER — Other Ambulatory Visit: Payer: Self-pay

## 2020-04-07 DIAGNOSIS — B351 Tinea unguium: Secondary | ICD-10-CM

## 2020-04-07 DIAGNOSIS — M2012 Hallux valgus (acquired), left foot: Secondary | ICD-10-CM

## 2020-04-07 DIAGNOSIS — M79675 Pain in left toe(s): Secondary | ICD-10-CM | POA: Diagnosis not present

## 2020-04-07 DIAGNOSIS — M2041 Other hammer toe(s) (acquired), right foot: Secondary | ICD-10-CM

## 2020-04-07 DIAGNOSIS — M79674 Pain in right toe(s): Secondary | ICD-10-CM

## 2020-04-07 DIAGNOSIS — E119 Type 2 diabetes mellitus without complications: Secondary | ICD-10-CM

## 2020-04-07 DIAGNOSIS — M2042 Other hammer toe(s) (acquired), left foot: Secondary | ICD-10-CM

## 2020-04-07 DIAGNOSIS — M2011 Hallux valgus (acquired), right foot: Secondary | ICD-10-CM | POA: Diagnosis not present

## 2020-04-07 DIAGNOSIS — E1149 Type 2 diabetes mellitus with other diabetic neurological complication: Secondary | ICD-10-CM | POA: Diagnosis not present

## 2020-04-07 DIAGNOSIS — L84 Corns and callosities: Secondary | ICD-10-CM | POA: Diagnosis not present

## 2020-04-07 NOTE — Patient Instructions (Signed)
Diabetes Mellitus and Foot Care Foot care is an important part of your health, especially when you have diabetes. Diabetes may cause you to have problems because of poor blood flow (circulation) to your feet and legs, which can cause your skin to:  Become thinner and drier.  Break more easily.  Heal more slowly.  Peel and crack. You may also have nerve damage (neuropathy) in your legs and feet, causing decreased feeling in them. This means that you may not notice minor injuries to your feet that could lead to more serious problems. Noticing and addressing any potential problems early is the best way to prevent future foot problems. How to care for your feet Foot hygiene  Wash your feet daily with warm water and mild soap. Do not use hot water. Then, pat your feet and the areas between your toes until they are completely dry. Do not soak your feet as this can dry your skin.  Trim your toenails straight across. Do not dig under them or around the cuticle. File the edges of your nails with an emery board or nail file.  Apply a moisturizing lotion or petroleum jelly to the skin on your feet and to dry, brittle toenails. Use lotion that does not contain alcohol and is unscented. Do not apply lotion between your toes. Shoes and socks  Wear clean socks or stockings every day. Make sure they are not too tight. Do not wear knee-high stockings since they may decrease blood flow to your legs.  Wear shoes that fit properly and have enough cushioning. Always look in your shoes before you put them on to be sure there are no objects inside.  To break in new shoes, wear them for just a few hours a day. This prevents injuries on your feet. Wounds, scrapes, corns, and calluses  Check your feet daily for blisters, cuts, bruises, sores, and redness. If you cannot see the bottom of your feet, use a mirror or ask someone for help.  Do not cut corns or calluses or try to remove them with medicine.  If you  find a minor scrape, cut, or break in the skin on your feet, keep it and the skin around it clean and dry. You may clean these areas with mild soap and water. Do not clean the area with peroxide, alcohol, or iodine.  If you have a wound, scrape, corn, or callus on your foot, look at it several times a day to make sure it is healing and not infected. Check for: ? Redness, swelling, or pain. ? Fluid or blood. ? Warmth. ? Pus or a bad smell. General instructions  Do not cross your legs. This may decrease blood flow to your feet.  Do not use heating pads or hot water bottles on your feet. They may burn your skin. If you have lost feeling in your feet or legs, you may not know this is happening until it is too late.  Protect your feet from hot and cold by wearing shoes, such as at the beach or on hot pavement.  Schedule a complete foot exam at least once a year (annually) or more often if you have foot problems. If you have foot problems, report any cuts, sores, or bruises to your health care provider immediately. Contact a health care provider if:  You have a medical condition that increases your risk of infection and you have any cuts, sores, or bruises on your feet.  You have an injury that is not   healing.  You have redness on your legs or feet.  You feel burning or tingling in your legs or feet.  You have pain or cramps in your legs and feet.  Your legs or feet are numb.  Your feet always feel cold.  You have pain around a toenail. Get help right away if:  You have a wound, scrape, corn, or callus on your foot and: ? You have pain, swelling, or redness that gets worse. ? You have fluid or blood coming from the wound, scrape, corn, or callus. ? Your wound, scrape, corn, or callus feels warm to the touch. ? You have pus or a bad smell coming from the wound, scrape, corn, or callus. ? You have a fever. ? You have a red line going up your leg. Summary  Check your feet every day  for cuts, sores, red spots, swelling, and blisters.  Moisturize feet and legs daily.  Wear shoes that fit properly and have enough cushioning.  If you have foot problems, report any cuts, sores, or bruises to your health care provider immediately.  Schedule a complete foot exam at least once a year (annually) or more often if you have foot problems. This information is not intended to replace advice given to you by your health care provider. Make sure you discuss any questions you have with your health care provider. Document Revised: 07/03/2019 Document Reviewed: 11/11/2016 Elsevier Patient Education  2020 Elsevier Inc.  

## 2020-04-12 NOTE — Progress Notes (Signed)
Subjective: Ernest Walker presents today preventative diabetic foot care and painful mycotic nails b/l that are difficult to trim. Pain interferes with ambulation. Aggravating factors include wearing enclosed shoe gear. Pain is relieved with periodic professional debridement.  [place] is patient's PCP.  Past Medical History:  Diagnosis Date  . Arthritis    hands, knees  . Carpal tunnel syndrome of right wrist   . Diabetes mellitus without complication ([place])   . Hypertension   . Prostate cancer [facility]) 2007   radiation implant done at [facility] in [facility]     Current Outpatient Medications on File Prior to Visit  Medication Sig Dispense Refill  . amLODipine (NORVASC) 10 MG tablet Take 10 mg by mouth daily.    Marland Kitchen aspirin 81 MG tablet Take 81 mg by mouth daily.    Marland Kitchen atorvastatin (LIPITOR) 20 MG tablet Take 20 mg by mouth daily.    Marland Kitchen gabapentin (NEURONTIN) 300 MG capsule Take 300 mg by mouth at bedtime.    . INSULIN ASPART FLEXPEN [place] Inject into the skin.    Marland Kitchen lisinopril (ZESTRIL) 40 MG tablet Take by mouth.    . metoprolol (LOPRESSOR) 50 MG tablet Take 50 mg by mouth 2 (two) times daily.    . NON FORMULARY [facility]  ANTIFUNGAL (NAIL)-#1    . pantoprazole (PROTONIX) 40 MG tablet Take 40 mg by mouth daily.    . sennosides-docusate sodium (SENOKOT-S) 8.6-50 MG tablet Take 1 tablet by mouth daily.    Marland Kitchen terazosin (HYTRIN) 10 MG capsule Take 10 mg by mouth at bedtime.     No current facility-administered medications on file prior to visit.     No Known Allergies  Objective: Ernest Walker is a pleasant 74 y.o. male WD, WN in NAD. AAO x 3.  There were no vitals filed for this visit.  Vascular Examination: Capillary fill time to digits <3 seconds b/l lower extremities. Palpable DP pulse(s) b/l lower extremities Palpable PT pulse(s) b/l lower extremities Pedal hair absent. Lower extremity skin temperature gradient within normal limits. No pain with calf compression b/l. No  edema noted b/l lower extremities.  Dermatological Examination: Pedal skin with normal turgor, texture and tone bilaterally. No open wounds bilaterally. No interdigital macerations bilaterally. Toenails 1-5 b/l elongated, discolored, dystrophic, thickened, crumbly with subungual debris and tenderness to dorsal palpation. Hyperkeratotic lesion(s) submet head 1 right foot.  No erythema, no edema, no drainage, no flocculence.  Musculoskeletal: Normal muscle strength 5/5 to all lower extremity muscle groups bilaterally. No pain crepitus or joint limitation noted with ROM b/l. Hallux valgus with bunion deformity noted b/l lower extremities. Hammertoes noted to the 2-5 bilaterally.  Neurological Examination: Protective sensation decreased with 10 gram monofilament b/l. Vibratory sensation intact b/l. Proprioception intact bilaterally.  Risk Categorization: Low Risk :  Patient has all of the following: Intact protective sensation No prior foot ulcer  No severe deformity Pedal pulses present  Assessment: 1. Pain due to onychomycosis of toenails of both feet   2. Callus   3. Hallux valgus, acquired, bilateral   4. Acquired hammertoes of both feet   5. Type II diabetes mellitus with neurological manifestations ([place])   6. Encounter for diabetic foot exam ([place])   Plan: -Examined patient. -Diabetic foot examination performed on today's visit. -Toenails 1-5 b/l were debrided in length and girth with sterile nail nippers and dremel without iatrogenic bleeding.  -Callus(es) submet head 1 right foot pared utilizing sterile scalpel blade without complication or incident. Total  number debrided =1. -Patient to report any pedal injuries to medical professional immediately. -Patient to continue soft, supportive shoe gear daily. Start procedure for diabetic shoes. Patient qualifies based on diagnoses. -Patient to continue soft, supportive shoe gear daily. -Patient/POA to call should there be  question/concern in the interim.  Return in about 6 weeks (around 05/19/2020) for diabetic nail and callus trim, at risk foot care.  Marzetta Board, DPM

## 2020-04-14 ENCOUNTER — Telehealth: Payer: Self-pay | Admitting: Podiatry

## 2020-04-14 NOTE — Telephone Encounter (Signed)
Left message with pts wife to have pt to call me back.

## 2020-04-21 ENCOUNTER — Other Ambulatory Visit: Payer: Non-veteran care | Admitting: Orthotics

## 2020-05-19 ENCOUNTER — Other Ambulatory Visit: Payer: Self-pay

## 2020-05-19 ENCOUNTER — Ambulatory Visit (INDEPENDENT_AMBULATORY_CARE_PROVIDER_SITE_OTHER): Payer: No Typology Code available for payment source | Admitting: Podiatry

## 2020-05-19 ENCOUNTER — Encounter: Payer: Self-pay | Admitting: Podiatry

## 2020-05-19 DIAGNOSIS — L84 Corns and callosities: Secondary | ICD-10-CM | POA: Diagnosis not present

## 2020-05-19 DIAGNOSIS — B351 Tinea unguium: Secondary | ICD-10-CM | POA: Diagnosis not present

## 2020-05-19 DIAGNOSIS — M2142 Flat foot [pes planus] (acquired), left foot: Secondary | ICD-10-CM

## 2020-05-19 DIAGNOSIS — M2011 Hallux valgus (acquired), right foot: Secondary | ICD-10-CM

## 2020-05-19 DIAGNOSIS — M2042 Other hammer toe(s) (acquired), left foot: Secondary | ICD-10-CM

## 2020-05-19 DIAGNOSIS — M79674 Pain in right toe(s): Secondary | ICD-10-CM

## 2020-05-19 DIAGNOSIS — M2012 Hallux valgus (acquired), left foot: Secondary | ICD-10-CM

## 2020-05-19 DIAGNOSIS — E1149 Type 2 diabetes mellitus with other diabetic neurological complication: Secondary | ICD-10-CM

## 2020-05-19 DIAGNOSIS — M2041 Other hammer toe(s) (acquired), right foot: Secondary | ICD-10-CM

## 2020-05-19 DIAGNOSIS — M79675 Pain in left toe(s): Secondary | ICD-10-CM

## 2020-05-19 DIAGNOSIS — M2141 Flat foot [pes planus] (acquired), right foot: Secondary | ICD-10-CM

## 2020-05-19 NOTE — Progress Notes (Signed)
Subjective: Ernest Walker presents today preventative diabetic foot care and painful mycotic nails b/l that are difficult to trim. Pain interferes with ambulation. Aggravating factors include wearing enclosed shoe gear. Pain is relieved with periodic professional debridement.   He states his callus is just now starting to feel uncomfortable. He voices no new pedal concerns on today's visit.   [place] is patient's PCP.  Past Medical History:  Diagnosis Date  . Arthritis    hands, knees  . Carpal tunnel syndrome of right wrist   . Diabetes mellitus without complication ([place])   . Hypertension   . Prostate cancer [facility]) 2007   radiation implant done at [facility] in [facility]     Current Outpatient Medications on File Prior to Visit  Medication Sig Dispense Refill  . amLODipine (NORVASC) 10 MG tablet Take 10 mg by mouth daily.    Marland Kitchen aspirin 81 MG tablet Take 81 mg by mouth daily.    Marland Kitchen atorvastatin (LIPITOR) 20 MG tablet Take 20 mg by mouth daily.    Marland Kitchen gabapentin (NEURONTIN) 300 MG capsule Take 300 mg by mouth at bedtime.    . INSULIN ASPART FLEXPEN [place] Inject into the skin.    Marland Kitchen lisinopril (ZESTRIL) 40 MG tablet Take by mouth.    . metoprolol (LOPRESSOR) 50 MG tablet Take 50 mg by mouth 2 (two) times daily.    . NON FORMULARY [facility]  ANTIFUNGAL (NAIL)-#1    . pantoprazole (PROTONIX) 40 MG tablet Take 40 mg by mouth daily.    . sennosides-docusate sodium (SENOKOT-S) 8.6-50 MG tablet Take 1 tablet by mouth daily.    Marland Kitchen terazosin (HYTRIN) 10 MG capsule Take 10 mg by mouth at bedtime.     No current facility-administered medications on file prior to visit.     No Known Allergies  Objective: [name] is a pleasant 74 y.o. African American male WD, WN in NAD. AAO x 3.  There were no vitals filed for this visit.  Vascular Examination: Capillary fill time to digits <3 seconds b/l lower extremities. Palpable DP pulse(s) b/l lower extremities Palpable PT pulse(s) b/l  lower extremities Pedal hair absent. Lower extremity skin temperature gradient within normal limits. No pain with calf compression b/l. No edema noted b/l lower extremities.  Dermatological Examination: Pedal skin with normal turgor, texture and tone bilaterally. No open wounds bilaterally. No interdigital macerations bilaterally. Toenails 1-5 b/l elongated, discolored, dystrophic, thickened, crumbly with subungual debris and tenderness to dorsal palpation. Hyperkeratotic lesion(s) submet head 1 right foot.  No erythema, no edema, no drainage, no flocculence.  Musculoskeletal: Normal muscle strength 5/5 to all lower extremity muscle groups bilaterally. No pain crepitus or joint limitation noted with ROM b/l. Hallux valgus with bunion deformity noted b/l lower extremities. Hammertoes noted to the 2-5 bilaterally. Pes planus deformity noted b/l.   Neurological Examination: Protective sensation decreased with 10 gram monofilament b/l. Vibratory sensation intact b/l. Proprioception intact bilaterally.  Risk Categorization: Low Risk :  Patient has all of the following: Intact protective sensation No prior foot ulcer  No severe deformity Pedal pulses present  Assessment: 1. Pain due to onychomycosis of toenails of both feet   2. Callus   3. Hallux valgus, acquired, bilateral   4. Acquired hammertoes of both feet   5. Pes planus of both feet   6. Type II diabetes mellitus with neurological manifestations ([place])     Plan: -Examined patient. -No new findings. No new orders. -Continue diabetic foot care  principles. -Dawn Rollene Fare is on vacation this week. I will have her give him a call on status of diabetic shoe approval through the [place] 1-5 b/l were debrided in length and girth with sterile nail nippers and dremel without iatrogenic bleeding.  -Callus(es) submet head 1 right foot pared utilizing sterile scalpel blade without complication or incident. Total number  debrided =1. -Patient to report any pedal injuries to medical professional immediately. -Patient to continue soft, supportive shoe gear daily. Start procedure for diabetic shoes. Patient qualifies based on diagnoses. -Patient to continue soft, supportive shoe gear daily. -Patient/POA to call should there be question/concern in the interim.  Return in about 6 weeks (around 06/30/2020) for diabetic nail and callus trim.  Marzetta Board, DPM

## 2020-06-04 ENCOUNTER — Telehealth: Payer: Self-pay | Admitting: *Deleted

## 2020-06-04 NOTE — Telephone Encounter (Signed)
Per Dr Jacqualyn Posey sent over a request for the cream to [facility] and faxed it on 06/03/2020. Lattie Haw

## 2020-07-01 ENCOUNTER — Other Ambulatory Visit: Payer: Self-pay

## 2020-07-01 ENCOUNTER — Ambulatory Visit (INDEPENDENT_AMBULATORY_CARE_PROVIDER_SITE_OTHER): Payer: Non-veteran care | Admitting: Podiatry

## 2020-07-01 ENCOUNTER — Encounter: Payer: Self-pay | Admitting: Podiatry

## 2020-07-01 DIAGNOSIS — M79675 Pain in left toe(s): Secondary | ICD-10-CM | POA: Diagnosis not present

## 2020-07-01 DIAGNOSIS — M79674 Pain in right toe(s): Secondary | ICD-10-CM | POA: Diagnosis not present

## 2020-07-01 DIAGNOSIS — M2012 Hallux valgus (acquired), left foot: Secondary | ICD-10-CM

## 2020-07-01 DIAGNOSIS — E1149 Type 2 diabetes mellitus with other diabetic neurological complication: Secondary | ICD-10-CM

## 2020-07-01 DIAGNOSIS — M2041 Other hammer toe(s) (acquired), right foot: Secondary | ICD-10-CM

## 2020-07-01 DIAGNOSIS — M2042 Other hammer toe(s) (acquired), left foot: Secondary | ICD-10-CM

## 2020-07-01 DIAGNOSIS — M2011 Hallux valgus (acquired), right foot: Secondary | ICD-10-CM

## 2020-07-01 DIAGNOSIS — B351 Tinea unguium: Secondary | ICD-10-CM

## 2020-07-01 NOTE — Progress Notes (Signed)
This patient returns to my office for at risk foot care.  This patient requires this care by a professional since this patient will be at risk due to having diabetes.  This patient is unable to cut nails himself since the patient cannot reach his nails.These nails are painful walking and wearing shoes.  This patient presents for at risk foot care today.  General Appearance  Alert, conversant and in no acute stress.  Vascular  Dorsalis pedis and posterior tibial  pulses are palpable  bilaterally.  Capillary return is within normal limits  bilaterally. Temperature is within normal limits  bilaterally.  Neurologic  Senn-Weinstein monofilament wire test within normal limits  bilaterally. Muscle power within normal limits bilaterally.  Nails Thick disfigured discolored nails with subungual debris  from hallux to fifth toes bilaterally. No evidence of bacterial infection or drainage bilaterally.  Orthopedic  No limitations of motion  feet .  No crepitus or effusions noted.  HAB  B/L.  Hammer toes  B/L.  Pes planus   Skin  normotropic skin with no porokeratosis noted bilaterally.  No signs of infections or ulcers noted.     Onychomycosis  Pain in right toes  Pain in left toes  Consent was obtained for treatment procedures.   Mechanical debridement of nails 1-5  bilaterally performed with a nail nipper.  Filed with dremel without incident.    Return office visit     6 weeks                 Told patient to return for periodic foot care and evaluation due to potential at risk complications.   Gardiner Barefoot DPM

## 2020-08-12 ENCOUNTER — Ambulatory Visit (INDEPENDENT_AMBULATORY_CARE_PROVIDER_SITE_OTHER): Payer: No Typology Code available for payment source | Admitting: Podiatry

## 2020-08-12 ENCOUNTER — Other Ambulatory Visit: Payer: Self-pay

## 2020-08-12 ENCOUNTER — Encounter: Payer: Self-pay | Admitting: Podiatry

## 2020-08-12 DIAGNOSIS — M2012 Hallux valgus (acquired), left foot: Secondary | ICD-10-CM

## 2020-08-12 DIAGNOSIS — M2142 Flat foot [pes planus] (acquired), left foot: Secondary | ICD-10-CM

## 2020-08-12 DIAGNOSIS — M2011 Hallux valgus (acquired), right foot: Secondary | ICD-10-CM

## 2020-08-12 DIAGNOSIS — M2141 Flat foot [pes planus] (acquired), right foot: Secondary | ICD-10-CM

## 2020-08-12 DIAGNOSIS — L84 Corns and callosities: Secondary | ICD-10-CM

## 2020-08-12 DIAGNOSIS — M79675 Pain in left toe(s): Secondary | ICD-10-CM

## 2020-08-12 DIAGNOSIS — E1149 Type 2 diabetes mellitus with other diabetic neurological complication: Secondary | ICD-10-CM

## 2020-08-12 DIAGNOSIS — M79674 Pain in right toe(s): Secondary | ICD-10-CM | POA: Diagnosis not present

## 2020-08-12 DIAGNOSIS — B351 Tinea unguium: Secondary | ICD-10-CM

## 2020-08-12 DIAGNOSIS — M2041 Other hammer toe(s) (acquired), right foot: Secondary | ICD-10-CM

## 2020-08-12 DIAGNOSIS — M2042 Other hammer toe(s) (acquired), left foot: Secondary | ICD-10-CM

## 2020-08-12 NOTE — Progress Notes (Addendum)
Subjective: Ernest Walker presents today preventative diabetic foot care and painful mycotic nails b/l that are difficult to trim. Pain interferes with ambulation. Aggravating factors include wearing enclosed shoe gear. Pain is relieved with periodic professional debridement.   He is still waiting for status of diabetic shoes. He says he needs to speak to his PCP at the [facility]. He voices no new pedal concerns on today's visit.   Ernest Walker is patient's PCP. Last visit was 07/01/2020.He states blood glucose was 170 mg/dl today. He attributes elevated reading to eating peach cobbler last night.  Past Medical History:  Diagnosis Date  . Arthritis    hands, knees  . Carpal tunnel syndrome of right wrist   . Diabetes mellitus without complication ([place])   . Hypertension   . Prostate cancer Ernest Walker) 2007   radiation implant done at [facility] in [facility]     Current Outpatient Medications on File Prior to Visit  Medication Sig Dispense Refill  . amLODipine (NORVASC) 10 MG tablet Take 10 mg by mouth daily.    Marland Kitchen aspirin 81 MG tablet Take 81 mg by mouth daily.    Marland Kitchen atorvastatin (LIPITOR) 20 MG tablet Take 20 mg by mouth daily.    Marland Kitchen gabapentin (NEURONTIN) 300 MG capsule Take 300 mg by mouth at bedtime.    . INSULIN ASPART FLEXPEN [place] Inject into the skin.    Marland Kitchen lisinopril (ZESTRIL) 40 MG tablet Take by mouth.    . metoprolol (LOPRESSOR) 50 MG tablet Take 50 mg by mouth 2 (two) times daily.    . NON FORMULARY [facility]  ANTIFUNGAL (NAIL)-#1    . pantoprazole (PROTONIX) 40 MG tablet Take 40 mg by mouth daily.    . sennosides-docusate sodium (SENOKOT-S) 8.6-50 MG tablet Take 1 tablet by mouth daily.    Marland Kitchen terazosin (HYTRIN) 10 MG capsule Take 10 mg by mouth at bedtime.     No current facility-administered medications on file prior to visit.     No Known Allergies  Objective: Ernest Walker is a pleasant 74 y.o. African American male WD, WN in NAD. AAO x 3.  There were  no vitals filed for this visit.  Vascular Examination: Capillary fill time to digits <3 seconds b/l lower extremities. Palpable DP pulse(s) b/l lower extremities Palpable PT pulse(s) b/l lower extremities Pedal hair absent. Lower extremity skin temperature gradient within normal limits. No pain with calf compression b/l. No edema noted b/l lower extremities.  Dermatological Examination: Pedal skin with normal turgor, texture and tone bilaterally. No open wounds bilaterally. No interdigital macerations bilaterally. Toenails 1-5 b/l elongated, discolored, dystrophic, thickened, crumbly with subungual debris and tenderness to dorsal palpation. Hyperkeratotic lesion(s) submet head 1 right foot.  No erythema, no edema, no drainage, no flocculence.  Musculoskeletal: Normal muscle strength 5/5 to all lower extremity muscle groups bilaterally. No pain crepitus or joint limitation noted with ROM b/l. Hallux valgus with bunion deformity noted b/l lower extremities. Hammertoes noted to the 2-5 bilaterally. Pes planus deformity noted b/l.   Neurological Examination: Protective sensation decreased with 10 gram monofilament b/l. Vibratory sensation intact b/l. Proprioception intact bilaterally.  Assessment: 1. Pain due to onychomycosis of toenails of both feet   2. Callus   3. Hallux valgus, acquired, bilateral   4. Acquired hammertoes of both feet   5. Pes planus of both feet   6. Type II diabetes mellitus with neurological manifestations ([place])     Plan: -Examined patient. -No new findings. No new  orders. -Continue diabetic foot care principles. -Toenails 1-5 b/l were debrided in length and girth with sterile nail nippers and dremel without iatrogenic bleeding.  -Callus(es) submet head 1 right foot pared utilizing sterile scalpel blade without complication or incident. Total number debrided =1. -Patient to report any pedal injuries to medical professional immediately. -Patient to continue soft,  supportive shoe gear daily.  -Patient/POA to call should there be question/concern in the interim.  Return in about 6 weeks (around 09/23/2020).  Marzetta Board, DPM

## 2020-11-18 ENCOUNTER — Ambulatory Visit: Payer: No Typology Code available for payment source | Admitting: Podiatry

## 2020-11-18 ENCOUNTER — Encounter (HOSPITAL_COMMUNITY): Payer: Self-pay

## 2020-11-18 ENCOUNTER — Ambulatory Visit (HOSPITAL_COMMUNITY)
Admission: EM | Admit: 2020-11-18 | Discharge: 2020-11-18 | Disposition: A | Discharge status: Home / Self Care | Payer: Medicare Other | Attending: Medical Oncology | Admitting: Medical Oncology

## 2020-11-18 ENCOUNTER — Other Ambulatory Visit: Payer: Self-pay

## 2020-11-18 DIAGNOSIS — Z79899 Other long term (current) drug therapy: Secondary | ICD-10-CM | POA: Insufficient documentation

## 2020-11-18 DIAGNOSIS — R5383 Other fatigue: Secondary | ICD-10-CM

## 2020-11-18 DIAGNOSIS — J069 Acute upper respiratory infection, unspecified: Secondary | ICD-10-CM

## 2020-11-18 DIAGNOSIS — Z87891 Personal history of nicotine dependence: Secondary | ICD-10-CM | POA: Insufficient documentation

## 2020-11-18 DIAGNOSIS — Z7982 Long term (current) use of aspirin: Secondary | ICD-10-CM | POA: Insufficient documentation

## 2020-11-18 DIAGNOSIS — U071 COVID-19: Secondary | ICD-10-CM | POA: Diagnosis not present

## 2020-11-18 LAB — SARS CORONAVIRUS 2 (TAT 6-24 HRS): SARS Coronavirus 2: POSITIVE — AB

## 2020-11-18 MED ORDER — BENZONATATE 100 MG PO CAPS: 100.0000 mg | ORAL_CAPSULE | Freq: Three times a day (TID) | ORAL | 0 refills | Status: DC

## 2020-11-18 NOTE — ED Provider Notes (Signed)
[facility]    CSN: [id number] Arrival date & time: 11/18/20  1027      History   Chief Complaint Chief Complaint  Patient presents with  . Fatigue  . Generalized Body Aches    HPI Ernest Walker is a 75 y.o. male.   HPI   Fatigue and Body aches: Patient reports that last week he received his influenza vaccine.  Over the next few days he developed fatigue, body aches, nasal congestion, dry cough, mild headache.  He has never had a previous reaction to the influenza vaccination.  He has no new sick contacts.  He has been staying well-hydrated and taking Tylenol for his symptoms which have helped some.  He feels a bit better today after resting for the past 2 days.  He denies any known fever, shortness of breath, trouble breathing or swallowing, rash, chest pain, weight change, peripheral edema, loss of taste or smell.  He is vaccinated against COVID-19 but has not received his booster.    Past Medical History:  Diagnosis Date  . Arthritis    hands, knees  . Carpal tunnel syndrome of right wrist   . Diabetes mellitus without complication (HCC)   . Hypertension   . Prostate cancer [facility]) 2007   radiation implant done at [facility] in [facility]    Patient Active Problem List   Diagnosis Date Noted  . Carpal tunnel syndrome of right wrist 04/01/2019  . 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 Diabetes mellitus without complication (HCC) 09/25/2015  . Decreased ROM of wrist 09/14/2015  . Decreased sensation of hand and upper extremity 09/14/2015  . Decreased strength, endurance, and mobility 09/14/2015  . 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 HYPERLIPIDEMIA 03/10/2009  . HYPERTENSION 03/10/2009  . PULMONARY NODULE 03/10/2009  . DYSPNEA ON EXERTION 03/10/2009    Past Surgical History:  Procedure Laterality Date  . CARPAL TUNNEL RELEASE Right 08/18/2015   Procedure: RIGHT CARPAL TUNNEL RELEASE;  Surgeon: Cindee Salt, MD;  Location: [facility];   Service: Orthopedics;  Laterality: Right;  block in preop  . INCISION AND DRAINAGE DEEP NECK ABSCESS    . INSERTION PROSTATE RADIATION SEED  2007   at [place] in [place]  . [place] NERVE TRANSPOSITION Right 08/18/2015   Procedure:  DECOMPRESSION ULNAR NERVE RIGHT WRIST WITH DEBRIDEMENT ULNAR NERVE;  Surgeon: Cindee Salt, MD;  Location: [facility];  Service: Orthopedics;  Laterality: Right;  block in preop       Home Medications    Prior to Admission medications   Medication Sig Start Date End Date Taking? Authorizing Provider  benzonatate (TESSALON) 100 MG capsule Take 1 capsule (100 mg total) by mouth every 8 (eight) hours. 11/18/20  Yes Dreama Kuna, Maralyn Sago M, PA-C  amLODipine (NORVASC) 10 MG tablet Take 10 mg by mouth daily.    [provider]  aspirin 81 MG tablet Take 81 mg by mouth daily.    [provider]  atorvastatin (LIPITOR) 20 MG tablet Take 20 mg by mouth daily.    [provider]  gabapentin (NEURONTIN) 300 MG capsule Take 300 mg by mouth at bedtime.    [provider]  INSULIN ASPART FLEXPEN  Inject into the skin.    [provider]  lisinopril (ZESTRIL) 40 MG tablet Take by mouth.    [provider]  metoprolol (LOPRESSOR) 50 MG tablet Take 50 mg by mouth 2 (two) times daily.    [provider]  NON FORMULARY [facility]  ANTIFUNGAL (NAIL)-#1    [provider]  pantoprazole (PROTONIX) 40 MG tablet Take 40 mg by mouth daily.    [provider]  sennosides-docusate sodium (SENOKOT-S) 8.6-50 MG tablet Take 1 tablet by mouth daily.    [provider]  terazosin (HYTRIN) 10 MG capsule Take 10 mg by mouth at bedtime.    [provider]    Family History Family History  Family history unknown: Yes    Social History Social History   Tobacco Use  . Smoking status: Former Research scientist (life sciences)  . Smokeless tobacco: Never Used  Substance Use Topics  . Alcohol use: No  .  Drug use: No     Allergies   Patient has no known allergies.   Review of Systems Review of Systems  See above in HPI  Physical Exam Triage Vital Signs ED Triage Vitals  Enc Vitals Group     BP 11/18/20 1140 (!) 149/62     Pulse Rate 11/18/20 1140 (!) 102     Resp 11/18/20 1140 17     Temp 11/18/20 1140 98.2 F (36.8 C)     Temp Source 11/18/20 1140 Oral     SpO2 11/18/20 1140 97 %     Weight --      Height --      Head Circumference --      Peak Flow --      Pain Score 11/18/20 1138 5     Pain Loc --      Pain Edu? --      Excl. in [place]? --    No data found.  Updated Vital Signs BP (!) 149/62 (BP Location: Right Arm)   Pulse (!) 102   Temp 98.2 F (36.8 C) (Oral)   Resp 17   SpO2 97%   Physical Exam Constitutional:      General: He is not in acute distress.    Appearance: Normal appearance. He is normal weight. He is ill-appearing. He is not toxic-appearing or diaphoretic.  HENT:     Head: Normocephalic.     Nose: Congestion present. No rhinorrhea.     Mouth/Throat:     Mouth: Mucous membranes are moist.     Pharynx: No oropharyngeal exudate or posterior oropharyngeal erythema.     Comments: No evidence of pallor Eyes:     General:        Right eye: No discharge.        Left eye: No discharge.     Conjunctiva/sclera: Conjunctivae normal.     Pupils: Pupils are equal, round, and reactive to light.     Comments: No evidence of pallor  Cardiovascular:     Rate and Rhythm: Normal rate and regular rhythm.     Pulses: Normal pulses.     Heart sounds: Normal heart sounds. No murmur heard. No friction rub. No gallop.   Pulmonary:     Effort: Pulmonary effort is normal. No respiratory distress.     Breath sounds: Normal breath sounds. No stridor. No wheezing, rhonchi or rales.  Chest:     Chest wall: No tenderness.  Abdominal:     General: There is no distension.  Musculoskeletal:     Cervical back: Normal range of motion and neck supple.     Right  lower leg: No edema.     Left lower leg: No edema.  Lymphadenopathy:     Cervical: No cervical adenopathy.  Skin:    General: Skin  is warm and dry.     Coloration: Skin is not pale.  Neurological:     Mental Status: He is alert.  Psychiatric:        Mood and Affect: Mood normal.        Behavior: Behavior normal.        Thought Content: Thought content normal.        Judgment: Judgment normal.      UC Treatments / Results  Labs (all labs ordered are listed, but only abnormal results are displayed) Labs Reviewed  SARS CORONAVIRUS 2 (TAT 6-24 HRS)    Radiology No results found.  Procedures Procedures (including critical care time)  Medications Ordered in UC Medications - No data to display  Initial Impression / Assessment and Plan / UC Course  I have reviewed the triage vital signs and the nursing notes.  Pertinent labs & imaging results that were available during my care of the patient were reviewed by me and considered in my medical decision making (see chart for details).     New.  Concern that this is COVID-19 or other viral illness.  This does not appear to be influenza as he is afebrile and has no known sick contacts.  No evidence of pallor or peripheral edema.  No evidence of arrhythmia.  He appears stable but a bit under the weather.  We discussed rest, hydration, healthy balanced diet, deep breathing exercises to help prevent pneumonia and red flag symptoms and signs that would warrant more immediate medical attention and further work-up.  COVID-19 testing today.  Covid isolation precautions and quarantine procedures discussed with patient and also in handout given.  Sending in [place] for his cough.  We also discussed that if he is positive for COVID-19 his booster vaccination should be postponed x1 month.   Final Clinical Impressions(s) / UC Diagnoses   Final diagnoses:  URI with cough and congestion  Fatigue, unspecified type   Discharge Instructions    None    ED Prescriptions    Medication Sig Dispense Auth. Provider   benzonatate (TESSALON) 100 MG capsule Take 1 capsule (100 mg total) by mouth every 8 (eight) hours. 21 capsule Hughie Closs, [place]     PDMP not reviewed this encounter.   Hughie Closs, [place] 11/18/20 1438

## 2020-11-18 NOTE — ED Triage Notes (Signed)
Pt presents with fatigue and generalized body aches for past few days; pt states he just got his flu vaccine a few days ago at [facility] clinic.

## 2020-11-19 ENCOUNTER — Telehealth: Payer: Self-pay | Admitting: Infectious Diseases

## 2020-11-19 ENCOUNTER — Other Ambulatory Visit: Payer: Self-pay | Admitting: Infectious Diseases

## 2020-11-19 DIAGNOSIS — U071 COVID-19: Secondary | ICD-10-CM

## 2020-11-19 NOTE — Telephone Encounter (Signed)
Called to discuss with patient about COVID-19 symptoms and the use of one of the available treatments for those with mild to moderate Covid symptoms and at a high risk of hospitalization.  Pt appears to qualify for outpatient treatment due to co-morbid conditions and/or a member of an at-risk group in accordance with the FDA Emergency Use Authorization.    Symptom onset: 1/22 Vaccinated: yes  Booster? no Immunocompromised? no Qualifiers: Age, SVI 4, HTN   He has a cane and family that can help him get in the car for transportation arrangement. Will schedule tomorrow 1/28 and notify billing with [employer] insurance.    Janene Madeira

## 2020-11-19 NOTE — Progress Notes (Signed)
I connected by phone with Ernest Walker on 11/19/2020 at 8:49 AM to discuss the potential use of a new treatment for mild to moderate COVID-19 viral infection in non-hospitalized patients.  This patient is a 75 y.o. male that meets the FDA criteria for Emergency Use Authorization of COVID monoclonal antibody sotrovimab.  Has a (+) direct SARS-CoV-2 viral test result  Has mild or moderate COVID-19   Is NOT hospitalized due to COVID-19  Is within 10 days of symptom onset  Has at least one of the high risk factor(s) for progression to severe COVID-19 and/or hospitalization as defined in EUA.  Specific high risk criteria : Older age (>/= 77 yo), Cardiovascular disease or hypertension and Other high risk medical condition per CDC:  incompletely vaccinated, SVI 4   I have spoken and communicated the following to the patient or parent/caregiver regarding COVID monoclonal antibody treatment:  1. FDA has authorized the emergency use for the treatment of mild to moderate COVID-19 in adults and pediatric patients with positive results of direct SARS-CoV-2 viral testing who are 18 years of age and older weighing at least 40 kg, and who are at high risk for progressing to severe COVID-19 and/or hospitalization.  2. The significant known and potential risks and benefits of COVID monoclonal antibody, and the extent to which such potential risks and benefits are unknown.  3. Information on available alternative treatments and the risks and benefits of those alternatives, including clinical trials.  4. Patients treated with COVID monoclonal antibody should continue to self-isolate and use infection control measures (e.g., wear mask, isolate, social distance, avoid sharing personal items, clean and disinfect "high touch" surfaces, and frequent handwashing) according to CDC guidelines.   5. The patient or parent/caregiver has the option to accept or refuse COVID monoclonal antibody treatment.  After  reviewing this information with the patient, the patient has agreed to receive one of the available covid 19 monoclonal antibodies and will be provided an appropriate fact sheet prior to infusion. Janene Madeira, NP 11/19/2020 8:49 AM

## 2020-11-20 ENCOUNTER — Ambulatory Visit (HOSPITAL_COMMUNITY)
Admission: RE | Admit: 2020-11-20 | Discharge: 2020-11-20 | Disposition: A | Discharge status: Home / Self Care | Payer: Medicare Other | Source: Ambulatory Visit | Attending: Pulmonary Disease | Admitting: Pulmonary Disease

## 2020-11-20 DIAGNOSIS — U071 COVID-19: Secondary | ICD-10-CM | POA: Insufficient documentation

## 2020-11-20 MED ORDER — ALBUTEROL SULFATE HFA 108 (90 BASE) MCG/ACT IN AERS: 2.0000 | INHALATION_SPRAY | Freq: Once | RESPIRATORY_TRACT | Status: DC | PRN

## 2020-11-20 MED ORDER — SODIUM CHLORIDE 0.9 % IV SOLN: INTRAVENOUS | Status: DC | PRN

## 2020-11-20 MED ORDER — SODIUM CHLORIDE 0.9 % IV SOLN
500.0000 mg | Freq: Once | INTRAVENOUS | Status: AC
  Administered 2020-11-20: 500 mg via INTRAVENOUS

## 2020-11-20 MED ORDER — DIPHENHYDRAMINE HCL 50 MG/ML IJ SOLN: 50.0000 mg | Freq: Once | INTRAMUSCULAR | Status: DC | PRN

## 2020-11-20 MED ORDER — METHYLPREDNISOLONE SODIUM SUCC 125 MG IJ SOLR: 125.0000 mg | Freq: Once | INTRAMUSCULAR | Status: DC | PRN

## 2020-11-20 MED ORDER — FAMOTIDINE IN NACL 20-0.9 MG/50ML-% IV SOLN: 20.0000 mg | Freq: Once | INTRAVENOUS | Status: DC | PRN

## 2020-11-20 MED ORDER — EPINEPHRINE 0.3 MG/0.3ML IJ SOAJ: 0.3000 mg | Freq: Once | INTRAMUSCULAR | Status: DC | PRN

## 2020-11-20 NOTE — Progress Notes (Signed)
Patient reviewed Fact Sheet for Patients, Parents, and Caregivers for Emergency Use Authorization (EUA) of sotrovimab for the Treatment of Coronavirus. Patient also reviewed and is agreeable to the estimated cost of treatment. Patient is agreeable to proceed.   

## 2020-11-20 NOTE — Discharge Instructions (Signed)

## 2020-11-20 NOTE — Progress Notes (Signed)
Diagnosis: COVID-19  Physician: Dr. Patrick Wright  Procedure: Cov[facility] Infusion Clinic Med: Sotrovimab infusion - Provided patient with sotrovimab fact sheet for patients, parents, and caregivers prior to infusion.   Complications: No immediate complications noted  Discharge: Discharged home    

## 2020-11-29 ENCOUNTER — Emergency Department (HOSPITAL_COMMUNITY): Payer: No Typology Code available for payment source

## 2020-11-29 ENCOUNTER — Encounter (HOSPITAL_COMMUNITY): Payer: Self-pay | Admitting: Emergency Medicine

## 2020-11-29 ENCOUNTER — Inpatient Hospital Stay (HOSPITAL_COMMUNITY)
Admission: EM | Admit: 2020-11-29 | Discharge: 2020-12-05 | DRG: 683 | Disposition: A | Discharge status: Home / Self Care | Payer: No Typology Code available for payment source | Attending: Family Medicine | Admitting: Family Medicine

## 2020-11-29 ENCOUNTER — Telehealth (HOSPITAL_COMMUNITY): Payer: Self-pay | Admitting: Adult Health

## 2020-11-29 ENCOUNTER — Other Ambulatory Visit: Payer: Self-pay

## 2020-11-29 DIAGNOSIS — R7881 Bacteremia: Secondary | ICD-10-CM

## 2020-11-29 DIAGNOSIS — U071 COVID-19: Secondary | ICD-10-CM

## 2020-11-29 DIAGNOSIS — Z888 Allergy status to other drugs, medicaments and biological substances status: Secondary | ICD-10-CM

## 2020-11-29 DIAGNOSIS — Z794 Long term (current) use of insulin: Secondary | ICD-10-CM

## 2020-11-29 DIAGNOSIS — N189 Chronic kidney disease, unspecified: Secondary | ICD-10-CM

## 2020-11-29 DIAGNOSIS — Z8546 Personal history of malignant neoplasm of prostate: Secondary | ICD-10-CM

## 2020-11-29 DIAGNOSIS — Z79899 Other long term (current) drug therapy: Secondary | ICD-10-CM

## 2020-11-29 DIAGNOSIS — E860 Dehydration: Secondary | ICD-10-CM | POA: Diagnosis present

## 2020-11-29 DIAGNOSIS — I440 Atrioventricular block, first degree: Secondary | ICD-10-CM | POA: Diagnosis present

## 2020-11-29 DIAGNOSIS — E11319 Type 2 diabetes mellitus with unspecified diabetic retinopathy without macular edema: Secondary | ICD-10-CM | POA: Diagnosis present

## 2020-11-29 DIAGNOSIS — N179 Acute kidney failure, unspecified: Principal | ICD-10-CM | POA: Diagnosis present

## 2020-11-29 DIAGNOSIS — D649 Anemia, unspecified: Secondary | ICD-10-CM | POA: Diagnosis present

## 2020-11-29 DIAGNOSIS — E785 Hyperlipidemia, unspecified: Secondary | ICD-10-CM | POA: Diagnosis present

## 2020-11-29 DIAGNOSIS — Z8616 Personal history of COVID-19: Secondary | ICD-10-CM

## 2020-11-29 DIAGNOSIS — E1140 Type 2 diabetes mellitus with diabetic neuropathy, unspecified: Secondary | ICD-10-CM | POA: Diagnosis present

## 2020-11-29 DIAGNOSIS — D72829 Elevated white blood cell count, unspecified: Secondary | ICD-10-CM | POA: Diagnosis present

## 2020-11-29 DIAGNOSIS — B370 Candidal stomatitis: Secondary | ICD-10-CM | POA: Diagnosis present

## 2020-11-29 DIAGNOSIS — Z923 Personal history of irradiation: Secondary | ICD-10-CM

## 2020-11-29 DIAGNOSIS — R9431 Abnormal electrocardiogram [ECG] [EKG]: Secondary | ICD-10-CM | POA: Diagnosis present

## 2020-11-29 DIAGNOSIS — E1122 Type 2 diabetes mellitus with diabetic chronic kidney disease: Secondary | ICD-10-CM | POA: Diagnosis present

## 2020-11-29 DIAGNOSIS — R911 Solitary pulmonary nodule: Secondary | ICD-10-CM | POA: Diagnosis present

## 2020-11-29 DIAGNOSIS — Z20822 Contact with and (suspected) exposure to covid-19: Secondary | ICD-10-CM | POA: Diagnosis present

## 2020-11-29 DIAGNOSIS — I129 Hypertensive chronic kidney disease with stage 1 through stage 4 chronic kidney disease, or unspecified chronic kidney disease: Secondary | ICD-10-CM | POA: Diagnosis present

## 2020-11-29 DIAGNOSIS — Z87891 Personal history of nicotine dependence: Secondary | ICD-10-CM

## 2020-11-29 DIAGNOSIS — Z7982 Long term (current) use of aspirin: Secondary | ICD-10-CM

## 2020-11-29 DIAGNOSIS — Z9114 Patient's other noncompliance with medication regimen: Secondary | ICD-10-CM

## 2020-11-29 DIAGNOSIS — Z9081 Acquired absence of spleen: Secondary | ICD-10-CM

## 2020-11-29 DIAGNOSIS — N400 Enlarged prostate without lower urinary tract symptoms: Secondary | ICD-10-CM | POA: Diagnosis present

## 2020-11-29 LAB — COMPREHENSIVE METABOLIC PANEL
ALT: 15 U/L (ref 0–44)
AST: 20 U/L (ref 15–41)
Albumin: 2.6 g/dL — ABNORMAL LOW (ref 3.5–5.0)
Alkaline Phosphatase: 59 U/L (ref 38–126)
Anion gap: 14 (ref 5–15)
BUN: 57 mg/dL — ABNORMAL HIGH (ref 8–23)
CO2: 17 mmol/L — ABNORMAL LOW (ref 22–32)
Calcium: 9 mg/dL (ref 8.9–10.3)
Chloride: 106 mmol/L (ref 98–111)
Creatinine, Ser: 3.84 mg/dL — ABNORMAL HIGH (ref 0.61–1.24)
GFR, Estimated: 16 mL/min — ABNORMAL LOW (ref >60)
Glucose, Bld: 190 mg/dL — ABNORMAL HIGH (ref 70–99)
Potassium: 4.5 mmol/L (ref 3.5–5.1)
Sodium: 137 mmol/L (ref 135–145)
Total Bilirubin: 0.4 mg/dL (ref 0.3–1.2)
Total Protein: 7.8 g/dL (ref 6.5–8.1)

## 2020-11-29 LAB — CBC
HCT: 36 % — ABNORMAL LOW (ref 39.0–52.0)
Hemoglobin: 10.8 g/dL — ABNORMAL LOW (ref 13.0–17.0)
MCH: 24.2 pg — ABNORMAL LOW (ref 26.0–34.0)
MCHC: 30 g/dL (ref 30.0–36.0)
MCV: 80.7 fL (ref 80.0–100.0)
Platelets: 321 10*3/uL (ref 150–400)
RBC: 4.46 MIL/uL (ref 4.22–5.81)
RDW: 15.2 % (ref 11.5–15.5)
WBC: 11.4 10*3/uL — ABNORMAL HIGH (ref 4.0–10.5)
nRBC: 0 % (ref 0.0–0.2)

## 2020-11-29 LAB — CBG MONITORING, ED
Glucose-Capillary: 186 mg/dL — ABNORMAL HIGH (ref 70–99)
Glucose-Capillary: 157 mg/dL — ABNORMAL HIGH (ref 70–99)
Glucose-Capillary: 135 mg/dL — ABNORMAL HIGH (ref 70–99)

## 2020-11-29 LAB — PHOSPHORUS: Phosphorus: 4.5 mg/dL (ref 2.5–4.6)

## 2020-11-29 LAB — TSH: TSH: 0.658 u[IU]/mL (ref 0.350–4.500)

## 2020-11-29 LAB — HIV ANTIBODY (ROUTINE TESTING W REFLEX): HIV Screen 4th Generation wRfx: NONREACTIVE

## 2020-11-29 LAB — MAGNESIUM: Magnesium: 2.6 mg/dL — ABNORMAL HIGH (ref 1.7–2.4)

## 2020-11-29 LAB — HEMOGLOBIN A1C
Hgb A1c MFr Bld: 7.7 % — ABNORMAL HIGH (ref 4.8–5.6)
Mean Plasma Glucose: 174.29 mg/dL

## 2020-11-29 LAB — LIPASE, BLOOD: Lipase: 51 U/L (ref 11–51)

## 2020-11-29 MED ORDER — SODIUM CHLORIDE 0.9 % IV SOLN: INTRAVENOUS | Status: DC

## 2020-11-29 MED ORDER — METOPROLOL TARTRATE 50 MG PO TABS
50.0000 mg | ORAL_TABLET | Freq: Two times a day (BID) | ORAL | Status: DC
  Administered 2020-11-29 – 2020-12-05 (×12): 50 mg via ORAL
  Filled 2020-11-29 (×10): qty 1
  Filled 2020-11-29: qty 2
  Filled 2020-11-29: qty 1

## 2020-11-29 MED ORDER — INSULIN ASPART 100 UNIT/ML ~~LOC~~ SOLN: 0.0000 [IU] | Freq: Every day | SUBCUTANEOUS | Status: DC

## 2020-11-29 MED ORDER — INSULIN ASPART 100 UNIT/ML ~~LOC~~ SOLN
0.0000 [IU] | Freq: Three times a day (TID) | SUBCUTANEOUS | Status: DC
  Administered 2020-11-29 – 2020-11-30 (×2): 2 [IU] via SUBCUTANEOUS
  Administered 2020-12-01 – 2020-12-02 (×3): 1 [IU] via SUBCUTANEOUS
  Administered 2020-12-02: 2 [IU] via SUBCUTANEOUS
  Administered 2020-12-02 – 2020-12-03 (×2): 1 [IU] via SUBCUTANEOUS
  Administered 2020-12-03 – 2020-12-04 (×3): 2 [IU] via SUBCUTANEOUS
  Administered 2020-12-04: 1 [IU] via SUBCUTANEOUS
  Administered 2020-12-05: 2 [IU] via SUBCUTANEOUS

## 2020-11-29 MED ORDER — CLOTRIMAZOLE 10 MG MT TROC
10.0000 mg | Freq: Every day | OROMUCOSAL | Status: DC
  Administered 2020-11-29 – 2020-12-05 (×27): 10 mg via ORAL
  Filled 2020-11-29 (×41): qty 1

## 2020-11-29 MED ORDER — SODIUM CHLORIDE 0.9 % IV BOLUS
1000.0000 mL | Freq: Once | INTRAVENOUS | Status: AC
  Administered 2020-11-29: 1000 mL via INTRAVENOUS

## 2020-11-29 MED ORDER — TERAZOSIN HCL 5 MG PO CAPS
10.0000 mg | ORAL_CAPSULE | Freq: Every day | ORAL | Status: DC
  Administered 2020-11-29 – 2020-12-04 (×6): 10 mg via ORAL
  Filled 2020-11-29 (×8): qty 2

## 2020-11-29 MED ORDER — AMLODIPINE BESYLATE 10 MG PO TABS
10.0000 mg | ORAL_TABLET | Freq: Every day | ORAL | Status: DC
  Administered 2020-11-29 – 2020-12-05 (×7): 10 mg via ORAL
  Filled 2020-11-29 (×6): qty 1
  Filled 2020-11-29: qty 2

## 2020-11-29 MED ORDER — HEPARIN SODIUM (PORCINE) 5000 UNIT/ML IJ SOLN
5000.0000 [IU] | Freq: Three times a day (TID) | INTRAMUSCULAR | Status: DC
  Administered 2020-11-29 – 2020-12-02 (×9): 5000 [IU] via SUBCUTANEOUS
  Filled 2020-11-29 (×9): qty 1

## 2020-11-29 MED ORDER — GLUCERNA SHAKE PO LIQD
237.0000 mL | Freq: Three times a day (TID) | ORAL | Status: DC
  Administered 2020-11-29 – 2020-12-05 (×14): 237 mL via ORAL
  Filled 2020-11-29 (×2): qty 237

## 2020-11-29 MED ORDER — ATORVASTATIN CALCIUM 10 MG PO TABS
20.0000 mg | ORAL_TABLET | Freq: Every day | ORAL | Status: DC
  Administered 2020-11-29 – 2020-12-05 (×7): 20 mg via ORAL
  Filled 2020-11-29 (×8): qty 2

## 2020-11-29 NOTE — ED Provider Notes (Addendum)
[facility] EMERGENCY DEPARTMENT Provider Note   CSN: [id number] Arrival date & time: 11/29/20  [id number]     History Chief Complaint  Patient presents with  . Weakness    Ernest Walker is a 75 y.o. male with past medical history of CKD, hypertension, prostate cancer s/p radiation implant, ?stomach and small bowel resection presents to ER for evaluation of generalized weakness. This began after he got his flu shot approx 2 weeks ago. Reports constant worsening fatigue, no energy. Slowly moving around at home has become harder. Usually he walks without assistive device or assistance. Now he is needing a walker and assistance at home.  It took his son to help him get out of the commode 20 min.  States sitting up "takes it out of me".  Denies focal or one sided weakness, numbness.  No unsteadiness but just feels too weak to walk.  Developed COVID symptoms 11/14/20. Diagnosed positive on 11/18/2020 at urgent care. Received sotrovimab infusion 11/20/20. Feels like after the infusion his weakness worsened significantly. Also developed white painful spots in his mouth, throat. Now having difficulty swallowing eating.  Had chicken noodle soup and broth last night. No food or water today.  Has an improving dry cough. Intermittent abdominal soreness, nausea. No Cp, Sob. Had diarrhea twice in the last 2 weeks, resolved. No constipation. No dysuria. Urinating less. No headache. No falls    HPI     Past Medical History:  Diagnosis Date  . Arthritis    hands, knees  . Carpal tunnel syndrome of right wrist   . Diabetes mellitus without complication ([place])   . Hypertension   . Prostate cancer [facility]) 2007   radiation implant done at [facility] in [facility]    Patient Active Problem List   Diagnosis Date Noted  . Carpal tunnel syndrome of right wrist 04/01/2019  . 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 Diabetes mellitus without complication ([place]) [id number]  . Decreased ROM of wrist  09/14/2015  . Decreased sensation of hand and upper extremity 09/14/2015  . Decreased strength, endurance, and mobility 09/14/2015  . 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 HYPERLIPIDEMIA 03/10/2009  . HYPERTENSION 03/10/2009  . PULMONARY NODULE 03/10/2009  . DYSPNEA ON EXERTION 03/10/2009    Past Surgical History:  Procedure Laterality Date  . CARPAL TUNNEL RELEASE Right 08/18/2015   Procedure: RIGHT CARPAL TUNNEL RELEASE;  Surgeon: Daryll Brod, MD;  Location: [place];  Service: Orthopedics;  Laterality: Right;  block in preop  . INCISION AND DRAINAGE DEEP NECK ABSCESS    . INSERTION PROSTATE RADIATION SEED  2007   at [place] in [place] Right 08/18/2015   Procedure:  DECOMPRESSION ULNAR NERVE RIGHT WRIST WITH DEBRIDEMENT ULNAR NERVE;  Surgeon: Daryll Brod, MD;  Location: [place];  Service: Orthopedics;  Laterality: Right;  block in preop       Family History  Family history unknown: Yes    Social History   Tobacco Use  . Smoking status: Former Research scientist (life sciences)  . Smokeless tobacco: Never Used  Substance Use Topics  . Alcohol use: No  . Drug use: No    Home Medications Prior to Admission medications   Medication Sig Start Date End Date Taking? Authorizing Provider  amLODipine (NORVASC) 10 MG tablet Take 10 mg by mouth daily.    [provider]  aspirin 81 MG tablet Take 81 mg by mouth daily.    [provider]  atorvastatin (LIPITOR) 20 MG tablet Take 20 mg by mouth daily.    [provider]  benzonatate (TESSALON) 100 MG capsule Take 1 capsule (100 mg total) by mouth every 8 (eight) hours. 11/18/20   Hughie Closs, PA-C  gabapentin (NEURONTIN) 300 MG capsule Take 300 mg by mouth at bedtime.    [provider]  INSULIN ASPART FLEXPEN [place] Inject into the skin.    [provider]  lisinopril (ZESTRIL) 40 MG tablet Take by mouth.    [provider]  metoprolol  (LOPRESSOR) 50 MG tablet Take 50 mg by mouth 2 (two) times daily.    [provider]  NON FORMULARY [facility]  ANTIFUNGAL (NAIL)-#1    [provider]  pantoprazole (PROTONIX) 40 MG tablet Take 40 mg by mouth daily.    [provider]  sennosides-docusate sodium (SENOKOT-S) 8.6-50 MG tablet Take 1 tablet by mouth daily.    [provider]  terazosin (HYTRIN) 10 MG capsule Take 10 mg by mouth at bedtime.    [provider]    Allergies    Patient has no known allergies.  Review of Systems   Review of Systems  Constitutional: Positive for appetite change and fatigue.  HENT: Positive for mouth sores and sore throat.   Gastrointestinal: Positive for abdominal pain.  Neurological: Positive for weakness (generalized).  All other systems reviewed and are negative.   Physical Exam Updated Vital Signs BP (!) 144/51   Pulse 82   Temp 97.8 F (36.6 C) (Oral)   Resp 20   SpO2 100%   Physical Exam Vitals and nursing note reviewed.  Constitutional:      Appearance: He is well-developed.     Comments: Appears very tired but in no acute distress  HENT:     Head: Normocephalic and atraumatic.     Nose: Nose normal.     Mouth/Throat:     Mouth: Mucous membranes are dry.     Comments: Lips are dry and chapped.  Mucous membranes tacky.  White plaques on buccal mucosa and oropharynx, tender and removable with tongue depressor.  Normal sublingual space.  Oropharynx is erythematous.  Tonsils not visualized.  Tolerating secretions.  Normal phonation.  No stridor. Eyes:     Conjunctiva/sclera: Conjunctivae normal.  Neck:     Comments: No anterior/posterior/lateral neck asymmetry, crepitus, tenderness.  Trachea midline.  No significant lymphadenopathy. Cardiovascular:     Rate and Rhythm: Normal rate and regular rhythm.     Heart sounds: Normal heart sounds.  Pulmonary:     Effort: Pulmonary effort is normal.     Breath sounds: Normal  breath sounds.  Abdominal:     General: Bowel sounds are normal.     Palpations: Abdomen is soft.     Tenderness: There is no abdominal tenderness.     Comments: Nontender.  Large midline surgical scar noted.  No G/R/R. No suprapubic or CVA tenderness. Negative Murphy's and McBurney's  Musculoskeletal:        General: Normal range of motion.     Cervical back: Normal range of motion.  Skin:    General: Skin is warm and dry.     Capillary Refill: Capillary refill takes less than 2 seconds.  Neurological:     Mental Status: He is alert.     Comments:   Mental Status: Patient is awake, alert, oriented to person, place, year, and situation. Patient is able to give a clear and coherent history. Speech is  fluent and clear without dysarthria or aphasia. No signs of neglect.  Cranial Nerves: I not tested II visual fields full bilaterally. PERRL.   III, IV, VI EOMs intact without ptosis V sensation to light touch intact in all 3 divisions of trigeminal nerve bilaterally  VII facial movements symmetric bilaterally VIII hearing intact to voice/conversation  IX, X no uvula deviation, symmetric rise of soft palate/uvula XI 5/5 SCM and trapezius strength bilaterally  XII tongue protrusion midline, symmetric L/R movements  Motor: Strength 5/5 in upper/lower extremities.  Sensation to light touch intact in face, upper/lower extremities. No pronator drift. Patient has right shoulder chronic pain limiting range of motion and strength in RUE. No leg drop.  Cerebellar: No ataxia with finger to nose. Patient required assistance to sit up and stand at bed. Unable to ambulate safely with one assistance (me). Needs assistance sitting up in bed.   Psychiatric:        Behavior: Behavior normal.     ED Results / Procedures / Treatments   Labs (all labs ordered are listed, but only abnormal results are displayed) Labs Reviewed  COMPREHENSIVE METABOLIC PANEL - Abnormal; Notable for the following  components:      Result Value   CO2 17 (*)    Glucose, Bld 190 (*)    BUN 57 (*)    Creatinine, Ser 3.84 (*)    Albumin 2.6 (*)    GFR, Estimated 16 (*)    All other components within normal limits  CBC - Abnormal; Notable for the following components:   WBC 11.4 (*)    Hemoglobin 10.8 (*)    HCT 36.0 (*)    MCH 24.2 (*)    All other components within normal limits  CBG MONITORING, ED - Abnormal; Notable for the following components:   Glucose-Capillary 186 (*)    All other components within normal limits  LIPASE, BLOOD  URINALYSIS, ROUTINE W REFLEX MICROSCOPIC    EKG None  Radiology No results found.  Procedures Ultrasound ED Peripheral IV (Provider)  Date/Time: 11/29/2020 3:30 PM Performed by: Kinnie Feil, PA-C Authorized by: Kinnie Feil, PA-C   Procedure details:    Indications: hydration, hypotension, multiple failed IV attempts and poor IV access     Skin Prep: chlorhexidine gluconate     Location:  Left AC   Angiocath:  20 G   Bedside Ultrasound Guided: Yes     Images: not archived     Patient tolerated procedure without complications: Yes     Dressing applied: Yes   Comments:     10 cc blood drawn for labs, flushed with 10 cc NS     Medications Ordered in ED Medications  sodium chloride 0.9 % bolus 1,000 mL (has no administration in time range)  clotrimazole (MYCELEX) troche 10 mg (has no administration in time range)    ED Course  I have reviewed the triage vital signs and the nursing notes.  Pertinent labs & imaging results that were available during my care of the patient were reviewed by me and considered in my medical decision making (see chart for details).  Clinical Course as of 11/29/20 1530  Sun Nov 29, 2020  1352 WBC(!): 11.4 [CG]  1352 Hemoglobin(!): 10.8 Last was 11.2 [CG]  1352 Creatinine(!): 3.84 Last 2.4 five years ago [CG]  1353 BUN(!): 57 [CG]  1353 GFR, Estimated(!): 16 [CG]  1353 ED EKG Sinus rhythm with 1st  degree A-V block Prolonged QT Abnormal ECG 484  [  CG]  1353 Sodium: 137 [CG]  1353 Potassium: 4.5 [CG]  1353 Glucose(!): 190 [CG]  1353 Anion gap: 14 [CG]  [facility]04 [facility] obtain last creatinine 2.5 1/12 [CG]  1505 [phone] Sunday Spillers  [CG]    Clinical Course User Index [CG] Arlean Hopping   MDM Rules/Calculators/A&P                           75 y.o. yo with chief complaint of worsening generalized weakness for 2 weeks. COVID symptoms 1/22, positive on 1/26 now on day 10.  History of CKD. Decreased PO intake, thrush, unable to walk on his own. Lingering but improving dry cough. Decreased urine output but no dysuria. No vomiting, diarrhea. No CP or SOB with exertion.  On exam patient appears dry, normal neuro exam. Requires assistance to sit up on bed, stand up. Typically ambulates independently.   Previous medical records available, triage and nursing notes reviewed to obtain more history and assist with MDM  Additional information obtained from wife whom I called. Medical records from [place] in [place] reviewed. Patient had lab work 1/12 and creatinine at that time was 2.5.   Chief complain involves an extensive number of treatment options and is a complaint that carries with it a high risk of complications and morbidity and mortality.    Differential diagnosis: electrolyte abnormality, dehydration, worsening COVID illness although reports overall symptoms have improved.   ER lab work and imaging ordered by triage RN and me, as above  I have personally visualized and interpreted ER diagnostic work up including labs and imaging.    Labs reveal - WBC 11.4. Hemoglobin 10.8 (baseline ~10), creatinine 3.84 last was 2.5 on 1/12. GFR 16. BUN 57. Pending UA  Imaging reveals - EKG reveals SR with 1st degree AV block, QT 484. Pending CXR, renal US.   Medications ordered - clotrimazole torch, 1 L IVF  Ordered continuous cardiac and pulse ox monitoring.  Will plan for serial  re-examinations. Close monitoring.   1530: Re-evaluated the patient.   Updated to lab work, plan to admit for IVF, dehydration, acute on chronic kidney injury. Wife updated. Discussed with EDP [place].   Final Clinical Impression(s) / ED Diagnoses Final diagnoses:  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, unspecified CKD stage, unspecified acute renal failure type [facility])    Rx / DC Orders ED Discharge Orders    None       Kinnie Feil, PA-C 11/29/20 1529    Kinnie Feil, PA-C 11/29/20 1530    Sherwood Gambler, MD 12/03/20 1610

## 2020-11-29 NOTE — ED Notes (Signed)
Pt had episode incontinent of loose stool, pt cleaned and replaced sheets. Brief applied to pt.

## 2020-11-29 NOTE — ED Notes (Signed)
Cancelled IV team, IV worked with manipulation

## 2020-11-29 NOTE — ED Notes (Signed)
I sent not to pharmacy to please send mycelex

## 2020-11-29 NOTE — ED Triage Notes (Signed)
Pt reports generalized weakness, nausea, and decreased appetite x 2 weeks.  Denies pain but states his abd has "a little discomfort".

## 2020-11-29 NOTE — Telephone Encounter (Signed)
I received a phone call this morning on the COVID19 [facility]AB on call line that Ernest Walker has been getting progressively weaker since his infusion that he received on 11/20/2020.  His wife was concerned that he could be experiencing side effects from the treatment.  She notes he has had white patches in his mouth, a sore throat, and has been having difficulty with eating and drinking.  He was previously able to walk on his own, and now he is requiring assistance to get up and around.  She is very concerned about him and his worsening weakness.    After discussion I reviewed with her that considering his increased weakness she should take him to the ER to be evaluated for dehydration.  She understands.  I let her know that it by what she is explaining it sounds like he developed thrush which led to his decreased oral intake and subsequent dehydration, but would recommend further in person evaluation of to determine the full extent of what is going on.  She understands.  She plans on bringing him to the [facility] ER for further evaluation.    Wilber Bihari, NP

## 2020-11-29 NOTE — ED Notes (Signed)
Pt states he is here because of no appetite for two weeks with intermittent nausea, denies vomiting, also states has been weak for the past two weeks States he is here today because of the progression of weekness and no appetite Intermittent HA and sore throat C/O white patches in his mouth
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
## 2020-11-29 NOTE — ED Notes (Signed)
Pt just returned from Ultrasound.

## 2020-11-29 NOTE — ED Notes (Signed)
To US via stretcher.

## 2020-11-30 DIAGNOSIS — E11319 Type 2 diabetes mellitus with unspecified diabetic retinopathy without macular edema: Secondary | ICD-10-CM | POA: Diagnosis present

## 2020-11-30 DIAGNOSIS — N179 Acute kidney failure, unspecified: Secondary | ICD-10-CM | POA: Diagnosis present

## 2020-11-30 DIAGNOSIS — N189 Chronic kidney disease, unspecified: Secondary | ICD-10-CM | POA: Diagnosis present

## 2020-11-30 DIAGNOSIS — E860 Dehydration: Secondary | ICD-10-CM | POA: Diagnosis present

## 2020-11-30 DIAGNOSIS — B370 Candidal stomatitis: Secondary | ICD-10-CM | POA: Diagnosis present

## 2020-11-30 DIAGNOSIS — Z7982 Long term (current) use of aspirin: Secondary | ICD-10-CM | POA: Diagnosis not present

## 2020-11-30 DIAGNOSIS — Z8546 Personal history of malignant neoplasm of prostate: Secondary | ICD-10-CM | POA: Diagnosis not present

## 2020-11-30 DIAGNOSIS — Z888 Allergy status to other drugs, medicaments and biological substances status: Secondary | ICD-10-CM | POA: Diagnosis not present

## 2020-11-30 DIAGNOSIS — B9561 Methicillin susceptible Staphylococcus aureus infection as the cause of diseases classified elsewhere: Secondary | ICD-10-CM | POA: Diagnosis not present

## 2020-11-30 DIAGNOSIS — Z8616 Personal history of COVID-19: Secondary | ICD-10-CM | POA: Diagnosis not present

## 2020-11-30 DIAGNOSIS — N400 Enlarged prostate without lower urinary tract symptoms: Secondary | ICD-10-CM | POA: Diagnosis present

## 2020-11-30 DIAGNOSIS — Z9081 Acquired absence of spleen: Secondary | ICD-10-CM | POA: Diagnosis not present

## 2020-11-30 DIAGNOSIS — E1122 Type 2 diabetes mellitus with diabetic chronic kidney disease: Secondary | ICD-10-CM | POA: Diagnosis present

## 2020-11-30 DIAGNOSIS — I440 Atrioventricular block, first degree: Secondary | ICD-10-CM | POA: Diagnosis present

## 2020-11-30 DIAGNOSIS — I129 Hypertensive chronic kidney disease with stage 1 through stage 4 chronic kidney disease, or unspecified chronic kidney disease: Secondary | ICD-10-CM | POA: Diagnosis present

## 2020-11-30 DIAGNOSIS — E1140 Type 2 diabetes mellitus with diabetic neuropathy, unspecified: Secondary | ICD-10-CM | POA: Diagnosis present

## 2020-11-30 DIAGNOSIS — R7881 Bacteremia: Secondary | ICD-10-CM | POA: Diagnosis not present

## 2020-11-30 DIAGNOSIS — Z9114 Patient's other noncompliance with medication regimen: Secondary | ICD-10-CM | POA: Diagnosis not present

## 2020-11-30 DIAGNOSIS — Z79899 Other long term (current) drug therapy: Secondary | ICD-10-CM | POA: Diagnosis not present

## 2020-11-30 DIAGNOSIS — R9431 Abnormal electrocardiogram [ECG] [EKG]: Secondary | ICD-10-CM | POA: Diagnosis present

## 2020-11-30 DIAGNOSIS — Z794 Long term (current) use of insulin: Secondary | ICD-10-CM | POA: Diagnosis not present

## 2020-11-30 DIAGNOSIS — R911 Solitary pulmonary nodule: Secondary | ICD-10-CM | POA: Diagnosis present

## 2020-11-30 DIAGNOSIS — D649 Anemia, unspecified: Secondary | ICD-10-CM | POA: Diagnosis present

## 2020-11-30 DIAGNOSIS — Z923 Personal history of irradiation: Secondary | ICD-10-CM | POA: Diagnosis not present

## 2020-11-30 DIAGNOSIS — U071 COVID-19: Secondary | ICD-10-CM | POA: Diagnosis not present

## 2020-11-30 DIAGNOSIS — Z20822 Contact with and (suspected) exposure to covid-19: Secondary | ICD-10-CM | POA: Diagnosis present

## 2020-11-30 DIAGNOSIS — E785 Hyperlipidemia, unspecified: Secondary | ICD-10-CM | POA: Diagnosis present

## 2020-11-30 LAB — CBC WITH DIFFERENTIAL/PLATELET
Abs Immature Granulocytes: 0.05 10*3/uL (ref 0.00–0.07)
Basophils Absolute: 0.1 10*3/uL (ref 0.0–0.1)
Basophils Relative: 1 %
Eosinophils Absolute: 0.2 10*3/uL (ref 0.0–0.5)
Eosinophils Relative: 2 %
HCT: 33.1 % — ABNORMAL LOW (ref 39.0–52.0)
Hemoglobin: 10.1 g/dL — ABNORMAL LOW (ref 13.0–17.0)
Immature Granulocytes: 1 %
Lymphocytes Relative: 25 %
Lymphs Abs: 2.3 10*3/uL (ref 0.7–4.0)
MCH: 24.5 pg — ABNORMAL LOW (ref 26.0–34.0)
MCHC: 30.5 g/dL (ref 30.0–36.0)
MCV: 80.1 fL (ref 80.0–100.0)
Monocytes Absolute: 0.7 10*3/uL (ref 0.1–1.0)
Monocytes Relative: 7 %
Neutro Abs: 5.9 10*3/uL (ref 1.7–7.7)
Neutrophils Relative %: 64 %
Platelets: 307 10*3/uL (ref 150–400)
RBC: 4.13 MIL/uL — ABNORMAL LOW (ref 4.22–5.81)
RDW: 15.5 % (ref 11.5–15.5)
WBC: 9.1 10*3/uL (ref 4.0–10.5)
nRBC: 0 % (ref 0.0–0.2)

## 2020-11-30 LAB — BLOOD CULTURE ID PANEL (REFLEXED) - BCID2

## 2020-11-30 LAB — CBG MONITORING, ED: Glucose-Capillary: 200 mg/dL — ABNORMAL HIGH (ref 70–99)

## 2020-11-30 LAB — GLUCOSE, CAPILLARY
Glucose-Capillary: 139 mg/dL — ABNORMAL HIGH (ref 70–99)
Glucose-Capillary: 148 mg/dL — ABNORMAL HIGH (ref 70–99)
Glucose-Capillary: 157 mg/dL — ABNORMAL HIGH (ref 70–99)

## 2020-11-30 LAB — BASIC METABOLIC PANEL
Anion gap: 11 (ref 5–15)
BUN: 59 mg/dL — ABNORMAL HIGH (ref 8–23)
CO2: 17 mmol/L — ABNORMAL LOW (ref 22–32)
Calcium: 8.9 mg/dL (ref 8.9–10.3)
Chloride: 113 mmol/L — ABNORMAL HIGH (ref 98–111)
Creatinine, Ser: 3.11 mg/dL — ABNORMAL HIGH (ref 0.61–1.24)
GFR, Estimated: 20 mL/min — ABNORMAL LOW (ref >60)
Glucose, Bld: 201 mg/dL — ABNORMAL HIGH (ref 70–99)
Potassium: 4.6 mmol/L (ref 3.5–5.1)
Sodium: 141 mmol/L (ref 135–145)

## 2020-11-30 LAB — SARS CORONAVIRUS 2 (TAT 6-24 HRS): SARS Coronavirus 2: NEGATIVE

## 2020-11-30 MED ORDER — VANCOMYCIN HCL 1250 MG/250ML IV SOLN: 1250.0000 mg | INTRAVENOUS | Status: DC

## 2020-11-30 MED ORDER — VANCOMYCIN HCL 2000 MG/400ML IV SOLN
2000.0000 mg | Freq: Once | INTRAVENOUS | Status: AC
  Administered 2020-11-30: 2000 mg via INTRAVENOUS
  Filled 2020-11-30: qty 400

## 2020-11-30 NOTE — Progress Notes (Signed)
Pharmacy Antibiotic Note  Ernest Walker is a 75 y.o. male admitted on 11/29/2020 with generalized weakness.  Pharmacy has been consulted for vancomycin dosing.  Patient currently afebrile, wbc normal at 9.1. Patient does have AKI on CKD with scr of 3.11. Now with 3/4 bottles positive for staph epidermidis indicating true infection.   Plan:  Vancomycin 2g now then 1250 mg IV Q 48 hrs. Goal AUC 400-550. Expected AUC: 465 SCr used: 3.11     Height: 5\' 10"  (177.8 cm) Weight: 86.2 kg (190 lb) IBW/kg (Calculated) : 73  Temp (24hrs), Avg:98 F (36.7 C), Min:97.8 F (36.6 C), Max:98.2 F (36.8 C)  Recent Labs  Lab 11/29/20 0747 11/30/20 1006  WBC 11.4* 9.1  CREATININE 3.84* 3.11*    Estimated Creatinine Clearance: 20.9 mL/min (A) (by C-G formula based on SCr of 3.11 mg/dL (H)).    Allergies  Allergen Reactions  . Drug Class [Niacin And Related] Rash    Thank you for allowing pharmacy to be a part of this patient's care.  Erin Hearing PharmD., BCPS Clinical Pharmacist 11/30/2020 5:20 PM

## 2020-11-30 NOTE — Plan of Care (Signed)
  Problem: Education: Goal: Knowledge of General Education information will improve Description Including pain rating scale, medication(s)/side effects and non-pharmacologic comfort measures Outcome: Progressing   

## 2020-11-30 NOTE — Progress Notes (Signed)
Blood cultures show growth of gram-positive cocci in 3/4 bottles.  Mr. Velardi was started on empiric vancomycin.  We will continue to monitor for speciation of the cultures and sensitivities.  Matilde Haymaker, MD
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
## 2020-11-30 NOTE — ED Notes (Signed)
+  MS Breakfast order placed
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
## 2020-11-30 NOTE — Progress Notes (Signed)
PHARMACY - PHYSICIAN COMMUNICATION CRITICAL VALUE ALERT - BLOOD CULTURE IDENTIFICATION (BCID)  Ernest Walker is an 75 y.o. male who presented to [facility] on 11/29/2020 with a chief complaint of generalized weakness.   Assessment: Patient now with multiple bottles growing staph epidermidis. Covid dx 1/26.   Name of physician (or Provider) Contacted: Ferdinand Lango MD aware, see note in chart below.   Current antibiotics: none  Changes to prescribed antibiotics recommended:  Recommendations accepted by provider - vancomycin to be started.   Results for orders placed or performed during the hospital encounter of 11/29/20  Blood Culture ID Panel (Reflexed) (Collected: 11/29/2020  5:40 PM)  Result Value Ref Range   Enterococcus faecalis NOT DETECTED NOT DETECTED   Enterococcus Faecium NOT DETECTED NOT DETECTED   Listeria monocytogenes NOT DETECTED NOT DETECTED   Staphylococcus species DETECTED (A) NOT DETECTED   Staphylococcus aureus (BCID) NOT DETECTED NOT DETECTED   Staphylococcus epidermidis DETECTED (A) NOT DETECTED   Staphylococcus lugdunensis NOT DETECTED NOT DETECTED   Streptococcus species NOT DETECTED NOT DETECTED   Streptococcus agalactiae NOT DETECTED NOT DETECTED   Streptococcus pneumoniae NOT DETECTED NOT DETECTED   Streptococcus pyogenes NOT DETECTED NOT DETECTED   A.calcoaceticus-baumannii NOT DETECTED NOT DETECTED   Bacteroides fragilis NOT DETECTED NOT DETECTED   Enterobacterales NOT DETECTED NOT DETECTED   Enterobacter cloacae complex NOT DETECTED NOT DETECTED   Escherichia coli NOT DETECTED NOT DETECTED   Klebsiella aerogenes NOT DETECTED NOT DETECTED   Klebsiella oxytoca NOT DETECTED NOT DETECTED   Klebsiella pneumoniae NOT DETECTED NOT DETECTED   Proteus species NOT DETECTED NOT DETECTED   Salmonella species NOT DETECTED NOT DETECTED   Serratia marcescens NOT DETECTED NOT DETECTED   Haemophilus influenzae NOT DETECTED NOT DETECTED   Neisseria meningitidis NOT DETECTED  NOT DETECTED   Pseudomonas aeruginosa NOT DETECTED NOT DETECTED   Stenotrophomonas maltophilia NOT DETECTED NOT DETECTED   Candida albicans NOT DETECTED NOT DETECTED   Candida auris NOT DETECTED NOT DETECTED   Candida glabrata NOT DETECTED NOT DETECTED   Candida krusei NOT DETECTED NOT DETECTED   Candida parapsilosis NOT DETECTED NOT DETECTED   Candida tropicalis NOT DETECTED NOT DETECTED   Cryptococcus neoformans/gattii NOT DETECTED NOT DETECTED   Methicillin resistance mecA/C NOT DETECTED NOT DETECTED   Erin Hearing PharmD., BCPS Clinical Pharmacist 11/30/2020 5:01 PM
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
## 2020-12-01 DIAGNOSIS — R7881 Bacteremia: Secondary | ICD-10-CM

## 2020-12-01 LAB — BASIC METABOLIC PANEL
Anion gap: 11 (ref 5–15)
BUN: 51 mg/dL — ABNORMAL HIGH (ref 8–23)
CO2: 16 mmol/L — ABNORMAL LOW (ref 22–32)
Calcium: 8.6 mg/dL — ABNORMAL LOW (ref 8.9–10.3)
Chloride: 112 mmol/L — ABNORMAL HIGH (ref 98–111)
Creatinine, Ser: 2.57 mg/dL — ABNORMAL HIGH (ref 0.61–1.24)
GFR, Estimated: 25 mL/min — ABNORMAL LOW (ref >60)
Glucose, Bld: 106 mg/dL — ABNORMAL HIGH (ref 70–99)
Potassium: 4.2 mmol/L (ref 3.5–5.1)
Sodium: 139 mmol/L (ref 135–145)

## 2020-12-01 LAB — CBC
HCT: 30.1 % — ABNORMAL LOW (ref 39.0–52.0)
Hemoglobin: 9.2 g/dL — ABNORMAL LOW (ref 13.0–17.0)
MCH: 24.3 pg — ABNORMAL LOW (ref 26.0–34.0)
MCHC: 30.6 g/dL (ref 30.0–36.0)
MCV: 79.6 fL — ABNORMAL LOW (ref 80.0–100.0)
Platelets: 289 10*3/uL (ref 150–400)
RBC: 3.78 MIL/uL — ABNORMAL LOW (ref 4.22–5.81)
RDW: 15.7 % — ABNORMAL HIGH (ref 11.5–15.5)
WBC: 8.6 10*3/uL (ref 4.0–10.5)
nRBC: 0 % (ref 0.0–0.2)

## 2020-12-01 LAB — GLUCOSE, CAPILLARY
Glucose-Capillary: 112 mg/dL — ABNORMAL HIGH (ref 70–99)
Glucose-Capillary: 138 mg/dL — ABNORMAL HIGH (ref 70–99)
Glucose-Capillary: 149 mg/dL — ABNORMAL HIGH (ref 70–99)
Glucose-Capillary: 153 mg/dL — ABNORMAL HIGH (ref 70–99)

## 2020-12-01 NOTE — TOC Initial Note (Signed)
Transition of Care [facility]) - Initial/Assessment Note    Patient Details  Name: Ernest Walker MRN: [id number] Date of Birth: 05-Oct-1946  Transition of Care [facility]) CM/SW Contact:    Joanne Chars, LCSW Phone Number: 12/01/2020, 3:26 PM  Clinical Narrative:  CSW met with pt to discuss discharge plan.  Pt reports that he understands the recommendation for [facility] but he is hoping to progress here in the hospital so that he does not need HH.  He worked with PT this AM and said he feels better.  CSW will continue to monitor if PT/OT recommendation changes.  Permission given to speak to wife Sunday Spillers, son Sherrie Sport.  Pt currently goes to [facility], PCP is Dr Consuella Lose.  Current equipment in the home: walker, rollator, cane, shower chair.                   Expected Discharge Plan: [place] Barriers to Discharge: Continued Medical Work up   Patient Goals and CMS Choice Patient states their goals for this hospitalization and ongoing recovery are:: "I know I can't get back to 100% but I'm hoping for 80-85%"      Expected Discharge Plan and Services Expected Discharge Plan: [place] Acute Care Choice:  (pt hoping to not have follow up services) Living arrangements for the past 2 months: Single Family Home                                      Prior Living Arrangements/Services Living arrangements for the past 2 months: Single Family Home Lives with:: Spouse,Adult Children Patient language and need for interpreter reviewed:: Yes Do you feel safe going back to the place where you live?: Yes      Need for Family Participation in Patient Care: Yes (Comment) Care giver support system in place?: Yes (comment) Current home services: Other (comment) (none) Criminal Activity/Legal Involvement Pertinent to Current Situation/Hospitalization: No - Comment as needed  Activities of Daily Living      Permission Sought/Granted Permission sought to share  information with : Family Supports Permission granted to share information with : Yes, Verbal Permission Granted  Share Information with NAME: wife Sunday Spillers, son Sherrie Sport           Emotional Assessment Appearance:: Appears stated age Attitude/Demeanor/Rapport: Engaged Affect (typically observed): Appropriate,Pleasant Orientation: : Oriented to Self,Oriented to Place,Oriented to Situation Alcohol / Substance Use: Not Applicable Psych Involvement: No (comment)  Admission diagnosis:  AKI (acute kidney injury) ([place]) [N17.9] 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, unspecified CKD stage, unspecified acute renal failure type ([place]) [N17.9, N18.9] Patient Active Problem List   Diagnosis Date Noted  . AKI (acute kidney injury) ([place]) 11/29/2020  . Carpal tunnel syndrome of right wrist 04/01/2019  . 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 Diabetes mellitus without complication ([place]) 10/93/2355  . Decreased ROM of wrist 09/14/2015  . Decreased sensation of hand and upper extremity 09/14/2015  . Decreased strength, endurance, and mobility 09/14/2015  . 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 HYPERLIPIDEMIA 03/10/2009  . HYPERTENSION 03/10/2009  . PULMONARY NODULE 03/10/2009  . DYSPNEA ON EXERTION 03/10/2009   PCP:  [place]:   [id number] [id number] [id number]: [phone]:  [phone]     Social Determinants of Health (SDOH) Interventions    Readmission Risk Interventions No flowsheet data found.
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
## 2020-12-02 LAB — CBC
HCT: 28.5 % — ABNORMAL LOW (ref 39.0–52.0)
Hemoglobin: 9 g/dL — ABNORMAL LOW (ref 13.0–17.0)
MCH: 25.2 pg — ABNORMAL LOW (ref 26.0–34.0)
MCHC: 31.6 g/dL (ref 30.0–36.0)
MCV: 79.8 fL — ABNORMAL LOW (ref 80.0–100.0)
Platelets: 299 10*3/uL (ref 150–400)
RBC: 3.57 MIL/uL — ABNORMAL LOW (ref 4.22–5.81)
RDW: 15.8 % — ABNORMAL HIGH (ref 11.5–15.5)
WBC: 7.8 10*3/uL (ref 4.0–10.5)
nRBC: 0 % (ref 0.0–0.2)

## 2020-12-02 LAB — CULTURE, BLOOD (ROUTINE X 2): Special Requests: ADEQUATE

## 2020-12-02 LAB — BASIC METABOLIC PANEL
Anion gap: 7 (ref 5–15)
BUN: 32 mg/dL — ABNORMAL HIGH (ref 8–23)
CO2: 17 mmol/L — ABNORMAL LOW (ref 22–32)
Calcium: 8.4 mg/dL — ABNORMAL LOW (ref 8.9–10.3)
Chloride: 116 mmol/L — ABNORMAL HIGH (ref 98–111)
Creatinine, Ser: 1.95 mg/dL — ABNORMAL HIGH (ref 0.61–1.24)
GFR, Estimated: 35 mL/min — ABNORMAL LOW (ref >60)
Glucose, Bld: 189 mg/dL — ABNORMAL HIGH (ref 70–99)
Potassium: 4.5 mmol/L (ref 3.5–5.1)
Sodium: 140 mmol/L (ref 135–145)

## 2020-12-02 LAB — GLUCOSE, CAPILLARY
Glucose-Capillary: 165 mg/dL — ABNORMAL HIGH (ref 70–99)
Glucose-Capillary: 138 mg/dL — ABNORMAL HIGH (ref 70–99)
Glucose-Capillary: 139 mg/dL — ABNORMAL HIGH (ref 70–99)
Glucose-Capillary: 106 mg/dL — ABNORMAL HIGH (ref 70–99)

## 2020-12-02 MED ORDER — ENOXAPARIN SODIUM 30 MG/0.3ML ~~LOC~~ SOLN
30.0000 mg | SUBCUTANEOUS | Status: DC
  Administered 2020-12-02 – 2020-12-04 (×3): 30 mg via SUBCUTANEOUS
  Filled 2020-12-02 (×3): qty 0.3

## 2020-12-02 MED ORDER — VANCOMYCIN HCL 1250 MG/250ML IV SOLN
1250.0000 mg | Freq: Once | INTRAVENOUS | Status: AC
  Administered 2020-12-02: 1250 mg via INTRAVENOUS
  Filled 2020-12-02: qty 250

## 2020-12-02 MED ORDER — CEFAZOLIN SODIUM-DEXTROSE 2-4 GM/100ML-% IV SOLN
2.0000 g | Freq: Three times a day (TID) | INTRAVENOUS | Status: DC
  Filled 2020-12-02: qty 100
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
## 2020-12-02 NOTE — Plan of Care (Signed)
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
## 2020-12-02 NOTE — TOC Progression Note (Signed)
Transition of Care [facility]) - Progression Note    Patient Details  Name: Ernest Walker MRN: [id number] Date of Birth: [birth year]  Transition of Care [facility]) CM/SW Contact  Joanne Chars, LCSW Phone Number: 12/02/2020, 2:22 PM  Clinical Narrative:   CSW spoke with Steva Ready at [facility] regarding [facility] services.  Tye [place] reports that pt has medicare as well as VA coverage and pt can obtain the [facility] by using his medicare.  CSW confirmed with pt that he does have medicare coverage.  Pt continues to say he is making progress and does not think he is going to need [facility] services at DC.     Expected Discharge Plan: [place] Barriers to Discharge: Continued Medical Work up  Expected Discharge Plan and Services Expected Discharge Plan: [place] Acute Care Choice:  (pt hoping to not have follow up services) Living arrangements for the past 2 months: Single Family Home                                       Social Determinants of Health (SDOH) Interventions    Readmission Risk Interventions No flowsheet data found.

## 2020-12-02 NOTE — Progress Notes (Addendum)
Addendum: Follow-up cultures now resulted with staph simulans and staph hominis 4/4 bottles. Discussed with FMT team; seems likely this is contamination but since these are staph species that did not grow in the first set of cultures, will resume broad-spectrum abx.   -Will give vancomycin 1,250mg  x1 and follow-up with susceptibilities. Plan to monitor renal function and de-escalate antibiotics as able tomorrow  Pharmacy Antibiotic Note  Ernest Walker is a 75 y.o. male admitted on 11/29/2020 with generalized weakness. Pharmacy has been consulted for vancomycin dosing, now transitioning to cefazolin.  Patient has remained afebrile, WBC WNL. Patient's AoCKD has been improving, SCr trending down from 3.84 on admission to 1.95, CrCl now 26mL/min. BCx have resulted S. epidermidis R to erythromycin only.    Plan:  D/c vancomycin Initiate cefazolin 2g IV every 8 hours Follow up with cultures, antibiotic de-escalation to PO and LOT Monitor renal function and clinical progress  Height: 5\' 10"  (177.8 cm) Weight: 86.2 kg (190 lb) IBW/kg (Calculated) : 73  Temp (24hrs), Avg:98.5 F (36.9 C), Min:98.5 F (36.9 C), Max:98.5 F (36.9 C)  Recent Labs  Lab 11/29/20 0747 11/30/20 1006 12/01/20 0248 12/02/20 0215  WBC 11.4* 9.1 8.6 7.8  CREATININE 3.84* 3.11* 2.57* 1.95*    Estimated Creatinine Clearance: 33.3 mL/min (A) (by C-G formula based on SCr of 1.95 mg/dL (H)).    Allergies  Allergen Reactions  . Drug Class [Niacin And Related] Rash    Thank you for allowing pharmacy to be a part of this patient's care.  Mercy Riding, PharmD PGY1 Acute Care Pharmacy Resident Please refer to [facility] for unit-specific pharmacist

## 2020-12-03 LAB — CBC
HCT: 29.2 % — ABNORMAL LOW (ref 39.0–52.0)
Hemoglobin: 9.1 g/dL — ABNORMAL LOW (ref 13.0–17.0)
MCH: 24.5 pg — ABNORMAL LOW (ref 26.0–34.0)
MCHC: 31.2 g/dL (ref 30.0–36.0)
MCV: 78.7 fL — ABNORMAL LOW (ref 80.0–100.0)
Platelets: 288 10*3/uL (ref 150–400)
RBC: 3.71 MIL/uL — ABNORMAL LOW (ref 4.22–5.81)
RDW: 15.8 % — ABNORMAL HIGH (ref 11.5–15.5)
WBC: 7.7 10*3/uL (ref 4.0–10.5)
nRBC: 0 % (ref 0.0–0.2)

## 2020-12-03 LAB — GLUCOSE, CAPILLARY
Glucose-Capillary: 154 mg/dL — ABNORMAL HIGH (ref 70–99)
Glucose-Capillary: 157 mg/dL — ABNORMAL HIGH (ref 70–99)
Glucose-Capillary: 125 mg/dL — ABNORMAL HIGH (ref 70–99)
Glucose-Capillary: 134 mg/dL — ABNORMAL HIGH (ref 70–99)

## 2020-12-03 LAB — BASIC METABOLIC PANEL
Anion gap: 8 (ref 5–15)
BUN: 25 mg/dL — ABNORMAL HIGH (ref 8–23)
CO2: 18 mmol/L — ABNORMAL LOW (ref 22–32)
Calcium: 8.3 mg/dL — ABNORMAL LOW (ref 8.9–10.3)
Chloride: 113 mmol/L — ABNORMAL HIGH (ref 98–111)
Creatinine, Ser: 1.93 mg/dL — ABNORMAL HIGH (ref 0.61–1.24)
GFR, Estimated: 35 mL/min — ABNORMAL LOW (ref >60)
Glucose, Bld: 145 mg/dL — ABNORMAL HIGH (ref 70–99)
Potassium: 4.4 mmol/L (ref 3.5–5.1)
Sodium: 139 mmol/L (ref 135–145)

## 2020-12-03 MED ORDER — VANCOMYCIN HCL 1000 MG/200ML IV SOLN
1000.0000 mg | INTRAVENOUS | Status: DC
  Administered 2020-12-03: 1000 mg via INTRAVENOUS
  Filled 2020-12-03 (×2): qty 200

## 2020-12-03 MED ORDER — VANCOMYCIN HCL 1000 MG/200ML IV SOLN
1000.0000 mg | INTRAVENOUS | Status: DC
  Filled 2020-12-03: qty 200

## 2020-12-03 NOTE — Progress Notes (Addendum)
  ADDENDUM: I discussed / reviewed the pharmacy note by Laurey Arrow and I agree with the student's findings and plans as documented.  Thank you for allowing [place] to participate in this patients care.   Jens Som, PharmD Please see amion for complete clinical pharmacist phone list. 12/03/2020 3:12 PM   Pharmacy Antibiotic Note  Ernest Walker is a 75 y.o. male admitted on 11/29/2020 with generalized weakness. Pharmacy has been consulted for vancomycin dosing.  Patient has remained afebrile, WBC WNL. Patient's AoCKD has been improving, SCr trending down from 3.84 on admission to 1.93, CrCl now 33.69mL/min. First set of BCx on 02/06 resulted S. epidermidis R to erythromycin only. Second BCx 02/07 resulted S. hominis and S. simulans 4/4 bottles pending susceptibilities. FM team discussed with ID; ID team to follow up once susceptibilities return.  Plan:  Vancomycin 1000 mg IV q24h Follow up with susceptibilities, antibiotic de-escalation and LOT Monitor renal function and clinical progress  Height: 5\' 10"  (177.8 cm) Weight: 86.2 kg (190 lb) IBW/kg (Calculated) : 73  Temp (24hrs), Avg:98.1 F (36.7 C), Min:97.9 F (36.6 C), Max:98.3 F (36.8 C)  Recent Labs  Lab 11/29/20 0747 11/30/20 1006 12/01/20 0248 12/02/20 0215 12/03/20 0308  WBC 11.4* 9.1 8.6 7.8 7.7  CREATININE 3.84* 3.11* 2.57* 1.95* 1.93*    Estimated Creatinine Clearance: 33.6 mL/min (A) (by C-G formula based on SCr of 1.93 mg/dL (H)).    Allergies  Allergen Reactions  . Drug Class [Niacin And Related] Rash    Thank you for allowing pharmacy to be a part of this patient's care.  Laurey Arrow  [facility] PharmD Candidate [phone]
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
## 2020-12-03 NOTE — Progress Notes (Signed)
Spoke with Dr. Prudencio Pair with infectious disease.  He was asked to consult on the case.  He noted that he was familiar with the case and had been speaking with the infectious disease pharmacist for the past day or 2 reguarding the interesting blood culture results.  He said that he would not consult on the case yet but rather would wait until further blood culture data was available.  Said no echocardiogram was needed at this time and to continue with vancomycin.  He encourage the primary team to call back when additional sensitivities were available for the most recent Staph hominis culture.  Matilde Haymaker, MD
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
## 2020-12-04 DIAGNOSIS — U071 COVID-19: Secondary | ICD-10-CM

## 2020-12-04 DIAGNOSIS — B9561 Methicillin susceptible Staphylococcus aureus infection as the cause of diseases classified elsewhere: Secondary | ICD-10-CM

## 2020-12-04 DIAGNOSIS — R7881 Bacteremia: Secondary | ICD-10-CM

## 2020-12-04 LAB — CULTURE, BLOOD (ROUTINE X 2)
Special Requests: ADEQUATE
Special Requests: ADEQUATE

## 2020-12-04 LAB — BASIC METABOLIC PANEL
Anion gap: 8 (ref 5–15)
BUN: 25 mg/dL — ABNORMAL HIGH (ref 8–23)
CO2: 18 mmol/L — ABNORMAL LOW (ref 22–32)
Calcium: 8.9 mg/dL (ref 8.9–10.3)
Chloride: 113 mmol/L — ABNORMAL HIGH (ref 98–111)
Creatinine, Ser: 1.99 mg/dL — ABNORMAL HIGH (ref 0.61–1.24)
GFR, Estimated: 34 mL/min — ABNORMAL LOW (ref >60)
Glucose, Bld: 152 mg/dL — ABNORMAL HIGH (ref 70–99)
Potassium: 4.9 mmol/L (ref 3.5–5.1)
Sodium: 139 mmol/L (ref 135–145)

## 2020-12-04 LAB — VANCOMYCIN, TROUGH: Vancomycin Tr: 18 ug/mL (ref 15–20)

## 2020-12-04 LAB — GLUCOSE, CAPILLARY
Glucose-Capillary: 142 mg/dL — ABNORMAL HIGH (ref 70–99)
Glucose-Capillary: 119 mg/dL — ABNORMAL HIGH (ref 70–99)
Glucose-Capillary: 186 mg/dL — ABNORMAL HIGH (ref 70–99)
Glucose-Capillary: 174 mg/dL — ABNORMAL HIGH (ref 70–99)

## 2020-12-04 MED ORDER — VANCOMYCIN HCL 1000 MG/200ML IV SOLN
1000.0000 mg | INTRAVENOUS | Status: DC
  Administered 2020-12-04: 1000 mg via INTRAVENOUS
  Filled 2020-12-04 (×2): qty 200

## 2020-12-04 NOTE — Plan of Care (Signed)
  Problem: Education: Goal: Knowledge of General Education information will improve Description: Including pain rating scale, medication(s)/side effects and non-pharmacologic comfort measures Outcome: Not Progressing   Problem: Health Behavior/Discharge Planning: Goal: Ability to manage health-related needs will improve Outcome: Not Progressing   Problem: Clinical Measurements: Goal: Ability to maintain clinical measurements within normal limits will improve Outcome: Not Progressing Goal: Will remain free from infection Outcome: Not Progressing Goal: Diagnostic test results will improve Outcome: Not Progressing Goal: Respiratory complications will improve Outcome: Not Progressing   Problem: Activity: Goal: Risk for activity intolerance will decrease Outcome: Not Progressing   Problem: Nutrition: Goal: Adequate nutrition will be maintained Outcome: Not Progressing   Problem: Coping: Goal: Level of anxiety will decrease Outcome: Not Progressing
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
## 2020-12-04 NOTE — TOC Progression Note (Signed)
Transition of Care [facility]) - Progression Note    Patient Details  Name: Ernest Walker MRN: [id number] Date of Birth: [birth year]  [facility] CM/SW Contact  Joanne Chars, LCSW Phone Number: 12/04/2020, 3:09 PM  Clinical Narrative: CSW met with pt regarding progress. Pt reports he walked a good distance yesterday with PT, went on the stairs.  Pt reports "I just need to pace myself a little bit."  Pt continues to decline HH referral at this time.       Expected Discharge Plan: [place] Barriers to Discharge: Continued Medical Work up  Expected Discharge Plan and Services Expected Discharge Plan: [place] Acute Care Choice:  (pt hoping to not have follow up services) Living arrangements for the past 2 months: Single Family Home                                       Social Determinants of Health (SDOH) Interventions    Readmission Risk Interventions No flowsheet data found.

## 2020-12-04 NOTE — Consult Note (Addendum)
[facility] for Infectious Disease    Date of Admission:  11/29/2020     Reason for Consult: bsi staph CoN    Referring Provider: Nori Riis   Lines:  Peripheral   Abx: 2/07-c vanc        Assessment: 75 yo male recent covid infection (dx'ed 1/26) admitted for poor intake, generalized weakness, with aki, and bcx with different/multiple species of CoNS  Suspect contaminant. Patient's evolution of sx suggestive of covid infectious process. He had mild leukocytosis 11 on admission 2/06 but no sepsis otherwise.   He has no catheter/cardiac device or other foreign hardware in his body to suggest a nidus for CoNS. Of all the CoNS, staph lugdenensis would be similar to staph aureus/mrsa in terms of virulence/pathologic potential. Otherwise, CoNS is usually a contaminant outside of special niduses present in the body.   We can wait on 2/6 staph species susceptibility profile and compare to 2/7 before making further decision on management/diagnostics    2/06 bcx staph epi 2 of 2 sets; staph hominis 1 of 2 sets 2/07 bcx staph hominis & simulans 2 of 2 sets 2/08 bcx ngtd    Plan: 1. F/u 2/06 bcx susceptibility 2. Continue vanc  3. covid infection (mild) had resolved/out of isolation; no specific inhouse tx needed  Active Problems:   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 ([place])   COVID-19 virus infection   Bacteremia   Scheduled Meds: . amLODipine  10 mg Oral Daily  . atorvastatin  20 mg Oral Daily  . clotrimazole  10 mg Oral 5 X Daily  . enoxaparin (LOVENOX) injection  30 mg Subcutaneous Q24H  . feeding supplement (GLUCERNA SHAKE)  237 mL Oral TID BM  . insulin aspart  0-5 Units Subcutaneous QHS  . insulin aspart  0-9 Units Subcutaneous TID WC  . metoprolol tartrate  50 mg Oral BID  . terazosin  10 mg Oral QHS   Continuous Infusions: . vancomycin Stopped (12/03/20 1905)   PRN Meds:.  HPI: Ernest Walker is a 75 y.o. male ckd, htn, prostate  cancer, recent covid infection, with poor intake/generalized weakness admitted 2/6 for supportive care, found to have different/multiple species of staph CoN species  He was dx'ed on 1/26 with covid and received sotrovimab on 1/28. He  Presented with frontal headache, mild dry cough, fatigue, lack of appetite. Due to progression of sx despite the sotrovimab infusion he came to ed for evaluation on 2/6  He was afebrile and only requiring room air, and stable hd. cxr shows minimal bibasilar atelectasis. Initially wbc 11, cr 3.8  A blood cx was sent for w/u and showed staph epi. Repeated cx showed staph hominis/simulan on 2 separate occasion. Susceptibility is pending  vanc started and he had remained on it  Since he came in, he reported 1 episode of diarrhea, but overall getting much better, eating better, feelign subjectively more energetic, no further headache, no sob  Denies rash/joint-back pain   Review of Systems: ROS Other ros negative  Past Medical History:  Diagnosis Date  . Arthritis    hands, knees  . Carpal tunnel syndrome of right wrist   . Diabetes mellitus without complication ([place])   . Hypertension   . Prostate cancer [facility]) 2007   radiation implant done at [facility] in [place] Use  . Smoking status: Former Research scientist (life sciences)  . Smokeless tobacco: Never Used  Substance Use  Topics  . Alcohol use: No  . Drug use: No    Family History  Family history unknown: Yes   Allergies  Allergen Reactions  . Drug Class [Niacin And Related] Rash    OBJECTIVE: Blood pressure (!) 144/64, pulse 71, temperature 98.5 F (36.9 C), temperature source Oral, resp. rate 18, height 5\' 10"  (1.778 m), weight 86.2 kg, SpO2 99 %.  Physical Exam Well appearing, no distress, conversant Heent: normocephalic; per; conj clear; eomi Neck supple Cv: rrr no mrg Lungs clear; normal respiratory effort abd s/nt Ext no edema Skin no rash Msk: no knees/ankles/elbows  swelling/tenderness/warmth; back nontender Neuro cn2-12 intact; strength/reflex symmetric Psych alert/oriented  Lab Results Lab Results  Component Value Date   WBC 7.7 12/03/2020   HGB 9.1 (L) 12/03/2020   HCT 29.2 (L) 12/03/2020   MCV 78.7 (L) 12/03/2020   PLT 288 12/03/2020    Lab Results  Component Value Date   CREATININE 1.99 (H) 12/04/2020   BUN 25 (H) 12/04/2020   NA 139 12/04/2020   K 4.9 12/04/2020   CL 113 (H) 12/04/2020   CO2 18 (L) 12/04/2020    Lab Results  Component Value Date   ALT 15 11/29/2020   AST 20 11/29/2020   ALKPHOS 59 11/29/2020   BILITOT 0.4 11/29/2020     Microbiology: Recent Results (from the past 240 hour(s))  Culture, blood (routine x 2)     Status: Abnormal   Collection Time: 11/29/20  5:40 PM   Specimen: BLOOD  Result Value Ref Range Status   Specimen Description BLOOD LEFT ANTECUBITAL  Final   Special Requests   Final    BOTTLES DRAWN AEROBIC AND ANAEROBIC Blood Culture adequate volume   Culture  Setup Time   Final    GRAM POSITIVE COCCI IN CLUSTERS IN BOTH AEROBIC AND ANAEROBIC BOTTLES CRITICAL RESULT CALLED TO, READ BACK BY AND VERIFIED WITHDion Body PHARMD 1624 11/30/20 A BROWNING Performed at [facility], [place] [address] [place], [place] [address]    Culture STAPHYLOCOCCUS EPIDERMIDIS (A)  Final   Report Status 12/02/2020 FINAL  Final   Organism ID, Bacteria STAPHYLOCOCCUS EPIDERMIDIS  Final      Susceptibility   Staphylococcus epidermidis - MIC*    CIPROFLOXACIN <=0.5 SENSITIVE Sensitive     ERYTHROMYCIN >=8 RESISTANT Resistant     GENTAMICIN <=0.5 SENSITIVE Sensitive     OXACILLIN <=0.25 SENSITIVE Sensitive     TETRACYCLINE 2 SENSITIVE Sensitive     VANCOMYCIN 1 SENSITIVE Sensitive     TRIMETH/SULFA <=10 SENSITIVE Sensitive     CLINDAMYCIN <=0.25 SENSITIVE Sensitive     RIFAMPIN <=0.5 SENSITIVE Sensitive     Inducible Clindamycin NEGATIVE Sensitive     * STAPHYLOCOCCUS EPIDERMIDIS  Blood Culture ID Panel  (Reflexed)     Status: Abnormal   Collection Time: 11/29/20  5:40 PM  Result Value Ref Range Status   Enterococcus faecalis NOT DETECTED NOT DETECTED Final   Enterococcus Faecium NOT DETECTED NOT DETECTED Final   Listeria monocytogenes NOT DETECTED NOT DETECTED Final   Staphylococcus species DETECTED (A) NOT DETECTED Final    Comment: CRITICAL RESULT CALLED TO, READ BACK BY AND VERIFIED WITH: J MILLEN PHARMD 1624 11/30/20 A BROWNING    Staphylococcus aureus (BCID) NOT DETECTED NOT DETECTED Final   Staphylococcus epidermidis DETECTED (A) NOT DETECTED Final    Comment: CRITICAL RESULT CALLED TO, READ BACK BY AND VERIFIED WITHDion Body PHARMD 1624 11/30/20 A BROWNING    Staphylococcus lugdunensis  NOT DETECTED NOT DETECTED Final   Streptococcus species NOT DETECTED NOT DETECTED Final   Streptococcus agalactiae NOT DETECTED NOT DETECTED Final   Streptococcus pneumoniae NOT DETECTED NOT DETECTED Final   Streptococcus pyogenes NOT DETECTED NOT DETECTED Final   A.calcoaceticus-baumannii NOT DETECTED NOT DETECTED Final   Bacteroides fragilis NOT DETECTED NOT DETECTED Final   Enterobacterales NOT DETECTED NOT DETECTED Final   Enterobacter cloacae complex NOT DETECTED NOT DETECTED Final   Escherichia coli NOT DETECTED NOT DETECTED Final   Klebsiella aerogenes NOT DETECTED NOT DETECTED Final   Klebsiella oxytoca NOT DETECTED NOT DETECTED Final   Klebsiella pneumoniae NOT DETECTED NOT DETECTED Final   Proteus species NOT DETECTED NOT DETECTED Final   Salmonella species NOT DETECTED NOT DETECTED Final   Serratia marcescens NOT DETECTED NOT DETECTED Final   Haemophilus influenzae NOT DETECTED NOT DETECTED Final   Neisseria meningitidis NOT DETECTED NOT DETECTED Final   Pseudomonas aeruginosa NOT DETECTED NOT DETECTED Final   Stenotrophomonas maltophilia NOT DETECTED NOT DETECTED Final   Candida albicans NOT DETECTED NOT DETECTED Final   Candida auris NOT DETECTED NOT DETECTED Final   Candida  glabrata NOT DETECTED NOT DETECTED Final   Candida krusei NOT DETECTED NOT DETECTED Final   Candida parapsilosis NOT DETECTED NOT DETECTED Final   Candida tropicalis NOT DETECTED NOT DETECTED Final   Cryptococcus neoformans/gattii NOT DETECTED NOT DETECTED Final   Methicillin resistance mecA/C NOT DETECTED NOT DETECTED Final    Comment: Performed at [facility], [address] [place], [place] [address]  Culture, blood (routine x 2)     Status: Abnormal (Preliminary result)   Collection Time: 11/29/20  5:45 PM   Specimen: BLOOD  Result Value Ref Range Status   Specimen Description BLOOD LEFT ANTECUBITAL  Final   Special Requests   Final    BOTTLES DRAWN AEROBIC AND ANAEROBIC Blood Culture adequate volume   Culture  Setup Time   Final    GRAM POSITIVE COCCI IN CLUSTERS IN BOTH AEROBIC AND ANAEROBIC BOTTLES IDENTIFICATION TO FOLLOW CRITICAL RESULT CALLED TO, READ BACK BY AND VERIFIED WITH: Dion Body PHARMD 1624 11/30/20 A BROWNING    Culture (A)  Final    STAPHYLOCOCCUS HOMINIS STAPHYLOCOCCUS EPIDERMIDIS SUSCEPTIBILITIES TO FOLLOW Performed at [facility], [place] [address], [place], [place] [address]    Report Status PENDING  Incomplete  SARS CORONAVIRUS 2 (TAT 6-24 HRS) Nasopharyngeal Nasopharyngeal Swab     Status: None   Collection Time: 11/29/20  6:02 PM   Specimen: Nasopharyngeal Swab  Result Value Ref Range Status   SARS Coronavirus 2 NEGATIVE NEGATIVE Final    Comment: (NOTE) SARS-CoV-2 target nucleic acids are NOT DETECTED.  The SARS-CoV-2 RNA is generally detectable in upper and lower respiratory specimens during the acute phase of infection. Negative results do not preclude SARS-CoV-2 infection, do not rule out co-infections with other pathogens, and should not be used as the sole basis for treatment or other patient management decisions. Negative results must be combined with clinical observations, patient history, and epidemiological information. The  expected result is Negative.  Fact Sheet for Patients: SugarRoll.be  Fact Sheet for Healthcare Providers: https://www.woods-mathews.com/  This test is not yet approved or cleared by the [place] FDA and  has been authorized for detection and/or diagnosis of SARS-CoV-2 by FDA under an Emergency Use Authorization (EUA). This EUA will remain  in effect (meaning this test can be used) for the duration of the COVID-19 declaration under Se ction  564(b)(1) of the Act, 21 U.S.C. section 360bbb-3(b)(1), unless the authorization is terminated or revoked sooner.  Performed at [facility], [place] [address] [place], [place] [address]   Culture, blood (routine x 2)     Status: Abnormal   Collection Time: 11/30/20  7:07 PM   Specimen: BLOOD  Result Value Ref Range Status   Specimen Description BLOOD BLOOD RIGHT FOREARM  Final   Special Requests   Final    BOTTLES DRAWN AEROBIC AND ANAEROBIC Blood Culture adequate volume   Culture  Setup Time   Final    GRAM POSITIVE COCCI IN CLUSTERS IN BOTH AEROBIC AND ANAEROBIC BOTTLES CRITICAL VALUE NOTED.  VALUE IS CONSISTENT WITH PREVIOUSLY REPORTED AND CALLED VALUE.    Culture (A)  Final    STAPHYLOCOCCUS SIMULANS STAPHYLOCOCCUS HOMINIS SUSCEPTIBILITIES PERFORMED ON PREVIOUS CULTURE WITHIN THE LAST 5 DAYS. Performed at [facility], [place] [address], [place], [place] [address]    Report Status 12/04/2020 FINAL  Final  Culture, blood (routine x 2)     Status: Abnormal   Collection Time: 11/30/20  7:15 PM   Specimen: BLOOD LEFT HAND  Result Value Ref Range Status   Specimen Description BLOOD LEFT HAND  Final   Special Requests   Final    BOTTLES DRAWN AEROBIC AND ANAEROBIC Blood Culture adequate volume   Culture  Setup Time   Final    GRAM POSITIVE COCCI IN CLUSTERS IN BOTH AEROBIC AND ANAEROBIC BOTTLES CRITICAL VALUE NOTED.  VALUE IS CONSISTENT WITH PREVIOUSLY REPORTED AND CALLED  VALUE. Performed at [facility], [place] [address] [place], [place] [address]    Culture STAPHYLOCOCCUS SIMULANS STAPHYLOCOCCUS HOMINIS  (A)  Final   Report Status 12/04/2020 FINAL  Final   Organism ID, Bacteria STAPHYLOCOCCUS SIMULANS  Final   Organism ID, Bacteria STAPHYLOCOCCUS HOMINIS  Final      Susceptibility   Staphylococcus hominis - MIC*    CIPROFLOXACIN 4 RESISTANT Resistant     ERYTHROMYCIN <=0.25 SENSITIVE Sensitive     GENTAMICIN <=0.5 SENSITIVE Sensitive     OXACILLIN <=0.25 SENSITIVE Sensitive     TETRACYCLINE <=1 SENSITIVE Sensitive     VANCOMYCIN 1 SENSITIVE Sensitive     TRIMETH/SULFA <=10 SENSITIVE Sensitive     CLINDAMYCIN <=0.25 SENSITIVE Sensitive     RIFAMPIN <=0.5 SENSITIVE Sensitive     Inducible Clindamycin NEGATIVE Sensitive     * STAPHYLOCOCCUS HOMINIS   Staphylococcus simulans - MIC*    CIPROFLOXACIN <=0.5 SENSITIVE Sensitive     ERYTHROMYCIN 1 INTERMEDIATE Intermediate     GENTAMICIN <=0.5 SENSITIVE Sensitive     OXACILLIN <=0.25 SENSITIVE Sensitive     TETRACYCLINE <=1 SENSITIVE Sensitive     VANCOMYCIN <=0.5 SENSITIVE Sensitive     TRIMETH/SULFA <=10 SENSITIVE Sensitive     CLINDAMYCIN RESISTANT Resistant     RIFAMPIN <=0.5 SENSITIVE Sensitive     Inducible Clindamycin POSITIVE Resistant     * STAPHYLOCOCCUS SIMULANS    Imaging: 2/06 cxr reviewed Minimal bibasilar atelectasis  Jabier Mutton, [place] for Infectious Disease [place] [phone] pager    12/04/2020, 12:56 PM

## 2020-12-04 NOTE — Progress Notes (Signed)
Pharmacy Antibiotic Note  Ernest Walker is a 75 y.o. male admitted on 11/29/2020 with generalized weakness. Pharmacy has been consulted for vancomycin dosing for possible Staph bacteremia.  Renal function is improving and vancomycin level is therapeutic at 18 mcg/mL (goal 15-20 mcg/mL).  Afebrile, WBC WNL.  Plan:  Continue vanc 1gm IV Q24H Monitor renal fxn, clinical progress, check vanc trough as needed  Height: 5\' 10"  (177.8 cm) Weight: 86.2 kg (190 lb) IBW/kg (Calculated) : 73  Temp (24hrs), Avg:98.7 F (37.1 C), Min:98.2 F (36.8 C), Max:99.5 F (37.5 C)  Recent Labs  Lab 11/29/20 0747 11/30/20 1006 12/01/20 0248 12/02/20 0215 12/03/20 0308 12/04/20 0659 12/04/20 1611  WBC 11.4* 9.1 8.6 7.8 7.7  --   --   CREATININE 3.84* 3.11* 2.57* 1.95* 1.93* 1.99*  --   VANCOTROUGH  --   --   --   --   --   --  18    Estimated Creatinine Clearance: 32.6 mL/min (A) (by C-G formula based on SCr of 1.99 mg/dL (H)).    Allergies  Allergen Reactions  . Drug Class [Niacin And Related] Rash    Vancomycin 2/7 >>   2/11 VL = 18 mcg/mL >> no change   1/26 COVID+ 2/6 COVID- 2/6 BCx: 3/4 GPC (Staph epi on BCID) 2/7 BCx: Staph simulans and hominis R to erythromycin, clinda and cipro 2/10 BCx:  Ernest Walker D. Mina Marble, PharmD, BCPS, [place] 12/04/2020, 6:05 PM

## 2020-12-05 LAB — CULTURE, BLOOD (ROUTINE X 2): Special Requests: ADEQUATE

## 2020-12-05 LAB — BASIC METABOLIC PANEL
Anion gap: 9 (ref 5–15)
BUN: 28 mg/dL — ABNORMAL HIGH (ref 8–23)
CO2: 17 mmol/L — ABNORMAL LOW (ref 22–32)
Calcium: 8.9 mg/dL (ref 8.9–10.3)
Chloride: 113 mmol/L — ABNORMAL HIGH (ref 98–111)
Creatinine, Ser: 2.16 mg/dL — ABNORMAL HIGH (ref 0.61–1.24)
GFR, Estimated: 31 mL/min — ABNORMAL LOW (ref >60)
Glucose, Bld: 187 mg/dL — ABNORMAL HIGH (ref 70–99)
Potassium: 4.9 mmol/L (ref 3.5–5.1)
Sodium: 139 mmol/L (ref 135–145)

## 2020-12-05 LAB — CBC
HCT: 31.7 % — ABNORMAL LOW (ref 39.0–52.0)
Hemoglobin: 9.6 g/dL — ABNORMAL LOW (ref 13.0–17.0)
MCH: 24.1 pg — ABNORMAL LOW (ref 26.0–34.0)
MCHC: 30.3 g/dL (ref 30.0–36.0)
MCV: 79.6 fL — ABNORMAL LOW (ref 80.0–100.0)
Platelets: 293 10*3/uL (ref 150–400)
RBC: 3.98 MIL/uL — ABNORMAL LOW (ref 4.22–5.81)
RDW: 16.1 % — ABNORMAL HIGH (ref 11.5–15.5)
WBC: 9.3 10*3/uL (ref 4.0–10.5)
nRBC: 0 % (ref 0.0–0.2)

## 2020-12-05 LAB — GLUCOSE, CAPILLARY
Glucose-Capillary: 190 mg/dL — ABNORMAL HIGH (ref 70–99)
Glucose-Capillary: 182 mg/dL — ABNORMAL HIGH (ref 70–99)

## 2020-12-05 NOTE — Plan of Care (Signed)
  Problem: Education: Goal: Knowledge of General Education information will improve Description: Including pain rating scale, medication(s)/side effects and non-pharmacologic comfort measures Outcome: Not Progressing   Problem: Health Behavior/Discharge Planning: Goal: Ability to manage health-related needs will improve Outcome: Not Progressing   Problem: Clinical Measurements: Goal: Ability to maintain clinical measurements within normal limits will improve Outcome: Not Progressing Goal: Will remain free from infection Outcome: Not Progressing Goal: Diagnostic test results will improve Outcome: Not Progressing Goal: Respiratory complications will improve Outcome: Not Progressing   Problem: Nutrition: Goal: Adequate nutrition will be maintained Outcome: Not Progressing   Problem: Coping: Goal: Level of anxiety will decrease Outcome: Not Progressing

## 2020-12-05 NOTE — Discharge Instructions (Signed)
Thank you for letting us take part in your care during your hospitalization.    You were treated for a possible blood infection with antibiotics. Your blood culture results showed that there was no true infection in your blood.  You do not need to continue antibiotics after discharge.  If you develop fevers or chills please seek medical attention.  Follow up with your PCP in 1 week

## 2020-12-05 NOTE — Progress Notes (Signed)
Spoke with ID Dr. Baxter Flattery regarding patient's susceptibility results of the second blood culture set versus the first blood culture set.  Due to the differences and sensitivities with antibiotics for the species identified, it appears that these are more likely to be contaminant.  Patient has had multiple days of very broad antibiotics and at this time do not feel that the antibiotics need to be continued upon discharge.  Patient will be given return precautions with recommended close follow-up with PCP.Marland Kitchen  Ernest Sauser, DO

## 2020-12-05 NOTE — Progress Notes (Signed)
Discharge packet given to patient.  Medications reviewed with patient and discharge teaching done to patient's satisfaction.  No questions at this time. PIV removed.  Pt assisted with putting clothes on and wheeled down to [facility].

## 2020-12-13 LAB — CULTURE, BLOOD (ROUTINE X 2)
Culture: NO GROWTH
Culture: NO GROWTH

## 2021-04-15 ENCOUNTER — Encounter (HOSPITAL_COMMUNITY): Payer: Self-pay

## 2021-04-15 ENCOUNTER — Ambulatory Visit (INDEPENDENT_AMBULATORY_CARE_PROVIDER_SITE_OTHER): Payer: No Typology Code available for payment source

## 2021-04-15 ENCOUNTER — Ambulatory Visit (HOSPITAL_COMMUNITY)
Admission: EM | Admit: 2021-04-15 | Discharge: 2021-04-15 | Disposition: A | Discharge status: Home / Self Care | Payer: No Typology Code available for payment source | Attending: Emergency Medicine | Admitting: Emergency Medicine

## 2021-04-15 ENCOUNTER — Other Ambulatory Visit: Payer: Self-pay

## 2021-04-15 DIAGNOSIS — M1711 Unilateral primary osteoarthritis, right knee: Secondary | ICD-10-CM | POA: Diagnosis not present

## 2021-04-15 DIAGNOSIS — M25561 Pain in right knee: Secondary | ICD-10-CM | POA: Diagnosis not present

## 2021-04-15 MED ORDER — PREDNISONE 10 MG (21) PO TBPK: ORAL_TABLET | Freq: Every day | ORAL | 0 refills | Status: DC

## 2021-04-15 NOTE — ED Triage Notes (Signed)
Patient presents to Urgent Care with complaints of right knee pain. Pt has a hx of arthritis.   Denies fever.

## 2021-04-15 NOTE — Discharge Instructions (Addendum)
Steroids can cause an elevation in sugar levels  Will need to call and see the va or orthopedic  If symptoms or redness becomes worse you will need to go to ER  Keep elevated and may use heat and ice as needed

## 2021-04-15 NOTE — ED Provider Notes (Signed)
[place]    CSN: [id number] Arrival date & time: 04/15/21  1059      History   Chief Complaint Chief Complaint  Patient presents with   Knee Pain    HPI Ernest Walker is a 75 y.o. male.   Pt has rt knee pain for 1 week. States that he has noticed swelling and it looks red. Denies any injury. Does have hx of arthritis. Walks with a cane. [facility]. Has not taken anything pta for knee.    Past Medical History:  Diagnosis Date   Arthritis    hands, knees   Carpal tunnel syndrome of right wrist    Diabetes mellitus without complication ([place])    Hypertension    Prostate cancer ([place]) 2007   radiation implant done at [facility] in [facility]    Patient Active Problem List   Diagnosis Date Noted   COVID-19 virus infection    Bacteremia    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 ([place]) 11/29/2020   Carpal tunnel syndrome of right wrist 04/01/2019   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   Diabetes mellitus without complication ([place]) 28/41/3244   Decreased ROM of wrist 09/14/2015   Decreased sensation of hand and upper extremity 09/14/2015   Decreased strength, endurance, and mobility 09/14/2015   OTHER DISEASES OF LARYNX 03/24/2009   HYPERLIPIDEMIA 03/10/2009   HYPERTENSION 03/10/2009   PULMONARY NODULE 03/10/2009   DYSPNEA ON EXERTION 03/10/2009    Past Surgical History:  Procedure Laterality Date   CARPAL TUNNEL RELEASE Right 08/18/2015   Procedure: RIGHT CARPAL TUNNEL RELEASE;  Surgeon: Daryll Brod, MD;  Location: [place];  Service: Orthopedics;  Laterality: Right;  block in preop   INCISION AND DRAINAGE DEEP NECK ABSCESS     INSERTION PROSTATE RADIATION SEED  2007   at [place] in [place] Right 08/18/2015   Procedure:  DECOMPRESSION ULNAR NERVE RIGHT WRIST WITH DEBRIDEMENT ULNAR NERVE;  Surgeon: Daryll Brod, MD;  Location: [place];  Service:  Orthopedics;  Laterality: Right;  block in preop       Home Medications    Prior to Admission medications   Medication Sig Start Date End Date Taking? Authorizing Provider  predniSONE (STERAPRED UNI-PAK 21 TAB) 10 MG (21) TBPK tablet Take by mouth daily. Take 6 tabs by mouth daily  for 2 days, then 5 tabs for 2 days, then 4 tabs for 2 days, then 3 tabs for 2 days, 2 tabs for 2 days, then 1 tab by mouth daily for 2 days 04/15/21  Yes Morley Kos L, NP  amLODipine (NORVASC) 10 MG tablet Take 10 mg by mouth daily.    [provider]  aspirin 81 MG tablet Take 81 mg by mouth daily.    [provider]  atorvastatin (LIPITOR) 20 MG tablet Take 20 mg by mouth daily.    [provider]  calcitRIOL (ROCALTROL) 0.25 MCG capsule Take 1 capsule by mouth daily. 11/04/20   [provider]  empagliflozin (JARDIANCE) 25 MG TABS tablet Take 0.5 tablets by mouth daily. 09/29/20   [provider]  ferrous sulfate 325 (65 FE) MG tablet Take 325 mg by mouth See admin instructions. Take one tablet on Monday, Wednesday, and Friday 11/09/20   [provider]  insulin glargine (LANTUS) 100 UNIT/ML Solostar Pen Inject 38 Units into the skin in the morning. 06/11/20  [provider]  metoprolol (LOPRESSOR) 50 MG tablet Take 50 mg by mouth 2 (two) times daily.    [provider]  NON FORMULARY [facility]  ANTIFUNGAL (NAIL)-#1    [provider]  ondansetron (ZOFRAN) 8 MG tablet Take 8 mg by mouth every 8 (eight) hours as needed. 11/04/20   [provider]  terazosin (HYTRIN) 10 MG capsule Take 10 mg by mouth at bedtime.    [provider]    Family History Family History  Family history unknown: Yes    Social History Social History   Tobacco Use   Smoking status: Former    Pack years: 0.00   Smokeless tobacco: Never  Substance Use Topics   Alcohol use: No   Drug use: No     Allergies   Drug  class [niacin and related]   Review of Systems Review of Systems  Constitutional: Negative.   Respiratory: Negative.    Cardiovascular: Negative.   Gastrointestinal: Negative.   Musculoskeletal:  Positive for joint swelling.  Skin:  Positive for color change.       Redness and +1 edema to rt knee   Neurological: Negative.     Physical Exam Triage Vital Signs ED Triage Vitals  Enc Vitals Group     BP 04/15/21 1218 (!) 152/61     Pulse Rate 04/15/21 1218 (!) 59     Resp 04/15/21 1218 16     Temp 04/15/21 1218 (!) 96.6 F (35.9 C)     Temp Source 04/15/21 1218 Oral     SpO2 04/15/21 1218 97 %     Weight --      Height --      Head Circumference --      Peak Flow --      Pain Score 04/15/21 1215 10     Pain Loc --      Pain Edu? --      Excl. in [place]? --    No data found.  Updated Vital Signs BP (!) 152/61 (BP Location: Right Arm)   Pulse (!) 59   Temp (!) 96.6 F (35.9 C) (Oral)   Resp 16   SpO2 97%   Visual Acuity     Physical Exam Constitutional:      Appearance: Normal appearance.  Cardiovascular:     Rate and Rhythm: Normal rate.  Pulmonary:     Effort: Pulmonary effort is normal.  Musculoskeletal:        General: Swelling and tenderness present. No signs of injury.     Right lower leg: Edema present.     Comments: +1 edema to lateral rt side of patella, slight erythema noted. Decreased ROM due to pain. Able to apply wt   Skin:    General: Skin is warm.     Capillary Refill: Capillary refill takes less than 2 seconds.     Findings: Erythema present.  Neurological:     Mental Status: He is alert.     UC Treatments / Results  Labs (all labs ordered are listed, but only abnormal results are displayed) Labs Reviewed - No data to display  EKG   Radiology DG Knee Complete 4 Views Right  Result Date: 04/15/2021 CLINICAL DATA:  swelling, pain, erythema EXAM: RIGHT KNEE - COMPLETE 4+ VIEW COMPARISON:  None. FINDINGS: No evidence of acute fracture.  There is tricompartment degenerative change with mild medial and lateral compartment joint space narrowing. No significant joint effusion. Vascular calcifications. Supine infrapatellar enthesophytes.  IMPRESSION: Tricompartment osteoarthritis of the right knee, with mild medial and lateral joint space narrowing. Electronically Signed   By: Maurine Simmering   On: 04/15/2021 13:14    Procedures Procedures (including critical care time)  Medications Ordered in UC Medications - No data to display  Initial Impression / Assessment and Plan / UC Course  I have reviewed the triage vital signs and the nursing notes.  Pertinent labs & imaging results that were available during my care of the patient were reviewed by me and considered in my medical decision making (see chart for details).     X ray shows arthritis to rt knee  Elevated area  Can use ice and heat  Will need to see [facility] for follow up or with ortho  Final Clinical Impressions(s) / UC Diagnoses   Final diagnoses:  Acute pain of right knee  Arthritis of right knee     Discharge Instructions      Steroids can cause an elevation in sugar levels  Will need to call and see the va or orthopedic  If symptoms or redness becomes worse you will need to go to ER  Keep elevated and may use heat and ice as needed       ED Prescriptions     Medication Sig Dispense Auth. Provider   predniSONE (STERAPRED UNI-PAK 21 TAB) 10 MG (21) TBPK tablet Take by mouth daily. Take 6 tabs by mouth daily  for 2 days, then 5 tabs for 2 days, then 4 tabs for 2 days, then 3 tabs for 2 days, 2 tabs for 2 days, then 1 tab by mouth daily for 2 days 42 tablet Marney Setting, NP      PDMP not reviewed this encounter.   Marney Setting, NP 04/15/21 1322

## 2021-11-01 ENCOUNTER — Ambulatory Visit (INDEPENDENT_AMBULATORY_CARE_PROVIDER_SITE_OTHER): Payer: No Typology Code available for payment source | Admitting: Podiatry

## 2021-11-01 ENCOUNTER — Other Ambulatory Visit: Payer: Self-pay

## 2021-11-01 ENCOUNTER — Encounter: Payer: Self-pay | Admitting: Podiatry

## 2021-11-01 DIAGNOSIS — M2011 Hallux valgus (acquired), right foot: Secondary | ICD-10-CM | POA: Diagnosis not present

## 2021-11-01 DIAGNOSIS — E11319 Type 2 diabetes mellitus with unspecified diabetic retinopathy without macular edema: Secondary | ICD-10-CM | POA: Insufficient documentation

## 2021-11-01 DIAGNOSIS — M79674 Pain in right toe(s): Secondary | ICD-10-CM | POA: Diagnosis not present

## 2021-11-01 DIAGNOSIS — E1140 Type 2 diabetes mellitus with diabetic neuropathy, unspecified: Secondary | ICD-10-CM | POA: Insufficient documentation

## 2021-11-01 DIAGNOSIS — M2141 Flat foot [pes planus] (acquired), right foot: Secondary | ICD-10-CM | POA: Diagnosis not present

## 2021-11-01 DIAGNOSIS — H2513 Age-related nuclear cataract, bilateral: Secondary | ICD-10-CM | POA: Insufficient documentation

## 2021-11-01 DIAGNOSIS — M79675 Pain in left toe(s): Secondary | ICD-10-CM

## 2021-11-01 DIAGNOSIS — E119 Type 2 diabetes mellitus without complications: Secondary | ICD-10-CM

## 2021-11-01 DIAGNOSIS — E1149 Type 2 diabetes mellitus with other diabetic neurological complication: Secondary | ICD-10-CM | POA: Diagnosis not present

## 2021-11-01 DIAGNOSIS — B351 Tinea unguium: Secondary | ICD-10-CM | POA: Diagnosis not present

## 2021-11-01 DIAGNOSIS — L84 Corns and callosities: Secondary | ICD-10-CM | POA: Diagnosis not present

## 2021-11-01 DIAGNOSIS — M545 Low back pain, unspecified: Secondary | ICD-10-CM | POA: Insufficient documentation

## 2021-11-01 DIAGNOSIS — N186 End stage renal disease: Secondary | ICD-10-CM | POA: Insufficient documentation

## 2021-11-01 DIAGNOSIS — M2142 Flat foot [pes planus] (acquired), left foot: Secondary | ICD-10-CM

## 2021-11-01 DIAGNOSIS — J309 Allergic rhinitis, unspecified: Secondary | ICD-10-CM | POA: Insufficient documentation

## 2021-11-01 DIAGNOSIS — M2012 Hallux valgus (acquired), left foot: Secondary | ICD-10-CM

## 2021-11-01 DIAGNOSIS — N189 Chronic kidney disease, unspecified: Secondary | ICD-10-CM | POA: Insufficient documentation

## 2021-11-01 DIAGNOSIS — C61 Malignant neoplasm of prostate: Secondary | ICD-10-CM | POA: Insufficient documentation

## 2021-11-05 NOTE — Progress Notes (Signed)
ANNUAL DIABETIC FOOT EXAM  Subjective: Ernest Walker presents today for for annual diabetic foot examination, at risk foot care with history of diabetic neuropathy, and callus(es) plantar aspect of right foot and painful thick toenails that are difficult to trim. Painful toenails interfere with ambulation. Aggravating factors include wearing enclosed shoe gear. Pain is relieved with periodic professional debridement. Painful calluses are aggravated when weightbearing with and without shoegear. Pain is relieved with periodic professional debridement..  Patient relates 44 year h/o diabetes.  Patient denies any h/o foot wounds.  Patient denies any numbness, tingling, burning, or pins/needle sensation in feet.  Patient's blood sugar was 148 mg/dl today.  [place] is patient's PCP. Last visit was 04/13/2021.  Past Medical History:  Diagnosis Date   Arthritis    hands, knees   Carpal tunnel syndrome of right wrist    Diabetes mellitus without complication ([place])    Hypertension    Prostate cancer ([place]) 2007   radiation implant done at [facility] in [facility]   Patient Active Problem List   Diagnosis Date Noted   Prostate cancer ([place]) 11/01/2021   CKD (chronic kidney disease) 11/01/2021   Low back pain 11/01/2021   Diabetic neuropathy ([place]) 11/01/2021   Diabetic retinopathy ([place]) 11/01/2021   Age-related nuclear cataract, bilateral 11/01/2021   Allergic rhinitis 11/01/2021   COVID-19 virus infection    Bacteremia    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 ([place]) 11/29/2020   Carpal tunnel syndrome of right wrist 04/01/2019   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   Diabetes mellitus without complication ([place]) 98/92/1194   Decreased ROM of wrist 09/14/2015   Decreased sensation of hand and upper extremity 09/14/2015   Decreased strength, endurance, and mobility 09/14/2015   OTHER DISEASES OF LARYNX 03/24/2009   HYPERLIPIDEMIA 03/10/2009   HYPERTENSION  03/10/2009   PULMONARY NODULE 03/10/2009   DYSPNEA ON EXERTION 03/10/2009   Past Surgical History:  Procedure Laterality Date   CARPAL TUNNEL RELEASE Right 08/18/2015   Procedure: RIGHT CARPAL TUNNEL RELEASE;  Surgeon: Daryll Brod, MD;  Location: [place];  Service: Orthopedics;  Laterality: Right;  block in preop   INCISION AND DRAINAGE DEEP NECK ABSCESS     INSERTION PROSTATE RADIATION SEED  2007   at [place] in [place] Right 08/18/2015   Procedure:  DECOMPRESSION ULNAR NERVE RIGHT WRIST WITH DEBRIDEMENT ULNAR NERVE;  Surgeon: Daryll Brod, MD;  Location: [place];  Service: Orthopedics;  Laterality: Right;  block in preop   Current Outpatient Medications on File Prior to Visit  Medication Sig Dispense Refill   amLODipine (NORVASC) 10 MG tablet Take 10 mg by mouth daily.     aspirin 81 MG tablet Take 81 mg by mouth daily.     atorvastatin (LIPITOR) 20 MG tablet Take 20 mg by mouth daily.     calcitRIOL (ROCALTROL) 0.25 MCG capsule Take 1 capsule by mouth daily.     empagliflozin (JARDIANCE) 25 MG TABS tablet Take 0.5 tablets by mouth daily.     ferrous sulfate 325 (65 FE) MG tablet Take 325 mg by mouth See admin instructions. Take one tablet on Monday, Wednesday, and Friday     insulin glargine (LANTUS) 100 UNIT/ML Solostar Pen Inject 38 Units into the skin in the morning.     metoprolol (LOPRESSOR) 50 MG tablet Take 50 mg by mouth 2 (two) times daily.     NON FORMULARY [facility]  ANTIFUNGAL (NAIL)-#1  ondansetron (ZOFRAN) 8 MG tablet Take 8 mg by mouth every 8 (eight) hours as needed.     predniSONE (STERAPRED UNI-PAK 21 TAB) 10 MG (21) TBPK tablet Take by mouth daily. Take 6 tabs by mouth daily  for 2 days, then 5 tabs for 2 days, then 4 tabs for 2 days, then 3 tabs for 2 days, 2 tabs for 2 days, then 1 tab by mouth daily for 2 days 42 tablet 0   terazosin (HYTRIN) 10 MG capsule Take 10 mg by mouth at bedtime.     No  current facility-administered medications on file prior to visit.    Allergies  Allergen Reactions   Cholecalciferol     Other reaction(s): Constipation   Glucophage [Metformin] Diarrhea   Drug Class [Niacin And Related] Rash   Niacin     Other reaction(s): Flushing   Social History   Occupational History   Not on file  Tobacco Use   Smoking status: Former   Smokeless tobacco: Never  Substance and Sexual Activity   Alcohol use: No   Drug use: No   Sexual activity: Not on file   Family History  Family history unknown: Yes    There is no immunization history on file for this patient.   Review of Systems: Negative except as noted in the HPI.   Objective: There were no vitals filed for this visit.  Ernest Walker is a pleasant 76 y.o. male in NAD. AAO X 3.  Vascular Examination: CFT <3 seconds b/l LE. Palpable DP/PT pulses b/l LE. Digital hair absent b/l. Skin temperature gradient WNL b/l. No pain with calf compression b/l. No edema noted b/l. No cyanosis or clubbing noted b/l LE.  Dermatological Examination: Pedal skin is warm and supple b/l LE. No open wounds b/l LE. No interdigital macerations noted b/l LE. Toenails 1-5 b/l elongated, discolored, dystrophic, thickened, crumbly with subungual debris and tenderness to dorsal palpation. Nail border hypertrophy left hallux with fungal debris. No erythema, no edema, no drainage, no fluctuance. Hyperkeratotic lesion(s) submet head 1 b/l.  No erythema, no edema, no drainage, no fluctuance.  Musculoskeletal Examination: Normal muscle strength 5/5 to all lower extremity muscle groups bilaterally. HAV with bunion deformity noted b/l LE. Hammertoe deformity noted 2-5 b/l. Pes planus deformity noted bilateral LE.Marland Kitchen No pain, crepitus or joint limitation noted with ROM b/l LE.  Patient ambulates independently without assistive aids.  Footwear Assessment: Does the patient wear appropriate shoes? Yes. Does the patient need  inserts/orthotics? Yes.  Neurological Examination: Protective sensation decreased with 10 gram monofilament b/l. Vibratory sensation intact b/l.  Hemoglobin A1C Latest Ref Rng & Units 11/29/2020  HGBA1C 4.8 - 5.6 % 7.7(H)  Some recent data might be hidden   Assessment: 1. Pain due to onychomycosis of toenails of both feet   2. Callus   3. Hallux valgus, acquired, bilateral   4. Pes planus of both feet   5. Type II diabetes mellitus with neurological manifestations ([place])   6. Encounter for diabetic foot exam ([place])     ADA Risk Categorization: High Risk  Patient has one or more of the following: Loss of protective sensation Absent pedal pulses Severe Foot deformity History of foot ulcer  Plan: -Diabetic foot examination performed today. -Continue foot and shoe inspections daily. Monitor blood glucose per PCP/Endocrinologist's recommendations. -Continue diabetic shoes daily. -Mycotic toenails 1-5 bilaterally were debrided in length and girth with sterile nail nippers and dremel without incident. Nail border left hallux curretaged of fungal  debris. No underlying wound/abscess. Triple antibiotic ointment applied. No further treatment required by patient. -Callus(es) submet head 1 b/l pared utilizing sterile scalpel blade without complication or incident. Total number debrided =2. -Patient/POA to call should there be question/concern in the interim.  Return in about 3 months (around 01/30/2022).  Marzetta Board, DPM

## 2022-02-07 ENCOUNTER — Other Ambulatory Visit: Payer: Self-pay

## 2022-02-07 ENCOUNTER — Encounter: Payer: Self-pay | Admitting: Podiatry

## 2022-02-07 ENCOUNTER — Encounter (HOSPITAL_COMMUNITY): Payer: Self-pay | Admitting: Emergency Medicine

## 2022-02-07 ENCOUNTER — Ambulatory Visit (HOSPITAL_COMMUNITY)
Admission: EM | Admit: 2022-02-07 | Discharge: 2022-02-07 | Disposition: A | Discharge status: Home / Self Care | Payer: No Typology Code available for payment source | Attending: Family Medicine | Admitting: Family Medicine

## 2022-02-07 ENCOUNTER — Ambulatory Visit (INDEPENDENT_AMBULATORY_CARE_PROVIDER_SITE_OTHER): Payer: No Typology Code available for payment source | Admitting: Podiatry

## 2022-02-07 DIAGNOSIS — E119 Type 2 diabetes mellitus without complications: Secondary | ICD-10-CM

## 2022-02-07 DIAGNOSIS — M533 Sacrococcygeal disorders, not elsewhere classified: Secondary | ICD-10-CM

## 2022-02-07 DIAGNOSIS — L84 Corns and callosities: Secondary | ICD-10-CM

## 2022-02-07 DIAGNOSIS — B351 Tinea unguium: Secondary | ICD-10-CM | POA: Insufficient documentation

## 2022-02-07 DIAGNOSIS — R69 Illness, unspecified: Secondary | ICD-10-CM | POA: Insufficient documentation

## 2022-02-07 DIAGNOSIS — E1149 Type 2 diabetes mellitus with other diabetic neurological complication: Secondary | ICD-10-CM

## 2022-02-07 DIAGNOSIS — H25099 Other age-related incipient cataract, unspecified eye: Secondary | ICD-10-CM | POA: Insufficient documentation

## 2022-02-07 DIAGNOSIS — M2012 Hallux valgus (acquired), left foot: Secondary | ICD-10-CM

## 2022-02-07 DIAGNOSIS — M79674 Pain in right toe(s): Secondary | ICD-10-CM

## 2022-02-07 DIAGNOSIS — K2100 Gastro-esophageal reflux disease with esophagitis, without bleeding: Secondary | ICD-10-CM | POA: Insufficient documentation

## 2022-02-07 DIAGNOSIS — M2011 Hallux valgus (acquired), right foot: Secondary | ICD-10-CM | POA: Diagnosis not present

## 2022-02-07 DIAGNOSIS — M2141 Flat foot [pes planus] (acquired), right foot: Secondary | ICD-10-CM

## 2022-02-07 DIAGNOSIS — M79675 Pain in left toe(s): Secondary | ICD-10-CM

## 2022-02-07 DIAGNOSIS — M2142 Flat foot [pes planus] (acquired), left foot: Secondary | ICD-10-CM

## 2022-02-07 DIAGNOSIS — H401211 Low-tension glaucoma, right eye, mild stage: Secondary | ICD-10-CM | POA: Insufficient documentation

## 2022-02-07 DIAGNOSIS — M109 Gout, unspecified: Secondary | ICD-10-CM | POA: Insufficient documentation

## 2022-02-07 MED ORDER — METHYLPREDNISOLONE 4 MG PO TBPK: ORAL_TABLET | ORAL | 0 refills | Status: DC

## 2022-02-07 NOTE — Discharge Instructions (Signed)
You were seen today for pain in your back, likely from you sacroiliac joint.  ?I have sent out a steroid pack to help.  ?You may continue tylenol for pain.  ?I also recommend using an ice pack.  ?Please follow up with your primary care provider if you continue to have issues.  ?

## 2022-02-07 NOTE — ED Triage Notes (Signed)
Left hip pain particular when getting up from supine position.  Non-radiating pain, that started Wednesday, 4/415/2023.  Denies fall.  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  Patient fell asleep on bus and not sire if arm rest pushing against side caused pain.   ?

## 2022-02-07 NOTE — ED Provider Notes (Signed)
?[place] ? ? ? ?CSN: [id number] ?Arrival date & time: 02/07/22  1022 ? ? ?  ? ?History   ?Chief Complaint ?Chief Complaint  ?Patient presents with  ? Hip Pain  ? ? ?HPI ?Ernest Walker is a 76 y.o. male.  ? ?Patient is here for left hip pain that started 5 days ago.  This started after he rode a bus from [place]. The pain comes and goes.  No pain while sitting or laying.  When he tries to get up he has pain.  Pain is actually at the left low back area.   No pain into the groin.  No pain into the leg.  ?He did try some tylenol without any help.  ? ?Past Medical History:  ?Diagnosis Date  ? Arthritis   ? hands, knees  ? Carpal tunnel syndrome of right wrist   ? Diabetes mellitus without complication ([place])   ? Hypertension   ? Prostate cancer [facility]) 2007  ? radiation implant done at [facility] in [place]  ? ? ?Patient Active Problem List  ? Diagnosis Date Noted  ? Gastro-esophageal reflux disease with esophagitis, without bleeding 02/07/2022  ? Gout 02/07/2022  ? Incipient senile cataract 02/07/2022  ? 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 Onychomycosis 02/07/2022  ? Other ill-defined and unknown causes of morbidity and mortality 02/07/2022  ? Prostate cancer ([place]) 11/01/2021  ? CKD (chronic kidney disease) 11/01/2021  ? Low back pain 11/01/2021  ? Diabetic neuropathy ([place]) 11/01/2021  ? Diabetic retinopathy ([place]) 11/01/2021  ? Age-related nuclear cataract, bilateral 11/01/2021  ? Allergic rhinitis 11/01/2021  ? COVID-19 virus infection   ? Bacteremia   ? 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 Carpal tunnel syndrome of right wrist 04/01/2019  ? 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 Diabetes mellitus without complication ([place]) 40/98/1191  ? Decreased ROM of wrist 09/14/2015  ? Decreased sensation of hand and upper extremity 09/14/2015  ? Decreased strength, endurance, and mobility 09/14/2015  ? OTHER DISEASES OF LARYNX 03/24/2009  ? HYPERLIPIDEMIA  03/10/2009  ? HYPERTENSION 03/10/2009  ? PULMONARY NODULE 03/10/2009  ? DYSPNEA ON EXERTION 03/10/2009  ? ? ?Past Surgical History:  ?Procedure Laterality Date  ? CARPAL TUNNEL RELEASE Right 08/18/2015  ? Procedure: RIGHT CARPAL TUNNEL RELEASE;  Surgeon: Daryll Brod, MD;  Location: [place];  Service: Orthopedics;  Laterality: Right;  block in preop  ? INCISION AND DRAINAGE DEEP NECK ABSCESS    ? INSERTION PROSTATE RADIATION SEED  2007  ? at [place] in [place] Right 08/18/2015  ? Procedure:  DECOMPRESSION ULNAR NERVE RIGHT WRIST WITH DEBRIDEMENT ULNAR NERVE;  Surgeon: Daryll Brod, MD;  Location: [place];  Service: Orthopedics;  Laterality: Right;  block in preop  ? ? ? ? ? ?Home Medications   ? ?Prior to Admission medications   ?Medication Sig Start Date End Date Taking? Authorizing Provider  ?amLODipine (NORVASC) 10 MG tablet Take 10 mg by mouth daily.    [provider]  ?aspirin 81 MG tablet Take 81 mg by mouth daily.    [provider]  ?atorvastatin (LIPITOR) 20 MG tablet Take 20 mg by mouth daily.    [provider]  ?calcitRIOL (ROCALTROL) 0.25 MCG capsule Take 1 capsule by mouth daily. 11/04/20   [provider]  ?empagliflozin (JARDIANCE) 25 MG TABS tablet Take 0.5 tablets by mouth daily. 09/29/20   [provider]  ?ferrous sulfate 325 (65 FE) MG tablet Take 325 mg by mouth See admin instructions. Take one tablet on Monday, Wednesday, and Friday 11/09/20   [provider]  ?insulin glargine (LANTUS) 100 UNIT/ML Solostar Pen Inject 38 Units into the skin in the morning. 06/11/20   [provider]  ?metoprolol (LOPRESSOR) 50 MG tablet Take 50 mg by mouth 2 (two) times daily.    [provider]  ?[place] ? ?ANTIFUNGAL (NAIL)-#1    [provider]  ?ondansetron (ZOFRAN) 8 MG tablet Take 8 mg by mouth every 8 (eight) hours as needed. 11/04/20   [provider]  ?predniSONE (STERAPRED UNI-PAK 21 TAB) 10 MG (21) TBPK tablet Take by mouth daily. Take 6 tabs by mouth daily  for 2 days, then 5 tabs for 2 days, then 4 tabs for 2 days, then 3 tabs for 2 days, 2 tabs for 2 days, then 1 tab by mouth daily for 2 days ?Patient not taking: Reported on 02/07/2022 04/15/21   Marney Setting, NP  ?terazosin (HYTRIN) 10 MG capsule Take 10 mg by mouth at bedtime.    [provider]  ? ? ?Family History ?Family History  ?Family history unknown: Yes  ? ? ?Social History ?Social History  ? ?Tobacco Use  ? Smoking status: Former  ? Smokeless tobacco: Never  ?Vaping Use  ? Vaping Use: Never used  ?Substance Use Topics  ? Alcohol use: No  ? Drug use: No  ? ? ? ?Allergies   ?Cholecalciferol, Glucophage [metformin], Drug class [niacin and related], and Niacin ? ? ?Review of Systems ?Review of Systems  ?Constitutional: Negative.   ?HENT: Negative.    ?Respiratory: Negative.    ?Cardiovascular: Negative.   ?Gastrointestinal: Negative.   ?Musculoskeletal:  Positive for back pain and gait problem.  ? ? ?Physical Exam ?Triage Vital Signs ?ED Triage Vitals  ?Enc Vitals Group  ?   BP 02/07/22 1147 114/61  ?   Pulse Rate 02/07/22 1147 60  ?   Resp 02/07/22 1147 18  ?   Temp 02/07/22 1147 97.8 ?F (36.6 ?C)  ?   Temp Source 02/07/22 1147 Oral  ?   SpO2 02/07/22 1147 98 %  ?   Weight --   ?   Height --   ?   Head Circumference --   ?   Peak Flow --   ?   Pain Score 02/07/22 1144 8  ?   Pain Loc --   ?   Pain Edu? --   ?   Excl. in [place]? --   ? ?No data found. ? ?Updated Vital Signs ?BP 114/61 (BP Location: Right Arm)   Pulse 60   Temp 97.8 ?F (36.6 ?C) (Oral)   Resp 18   SpO2 98%  ? ?Visual Acuity ?Right Eye Distance:   ?Left Eye Distance:   ?Bilateral Distance:   ? ?Right Eye Near:   ?Left Eye Near:    ?Bilateral Near:    ? ?Physical Exam ?Constitutional:   ?   Appearance: Normal appearance.  ?Cardiovascular:  ?   Rate and Rhythm: Normal rate and regular rhythm.  ?Pulmonary:  ?    Effort: Pulmonary effort is normal.  ?   Breath sounds: Normal breath sounds.  ?Musculoskeletal:  ?   Comments: No TTP to the spine;  no overt TTP to the paraspinals or low back, but he c/o pain at the left SI joint area.  Full  rom of the LE without weakness or limitation.   ?Neurological:  ?   Mental Status: He is alert.  ? ? ? ?UC Treatments / Results  ?Labs ?(all labs ordered are listed, but only abnormal results are displayed) ?Labs Reviewed - No data to display ? ?EKG ? ? ?Radiology ?No results found. ? ?Procedures ?Procedures (including critical care time) ? ?Medications Ordered in UC ?Medications - No data to display ? ?Initial Impression / Assessment and Plan / UC Course  ?I have reviewed the triage vital signs and the nursing notes. ? ?Pertinent labs & imaging results that were available during my care of the patient were reviewed by me and considered in my medical decision making (see chart for details). ? ?  ?Final Clinical Impressions(s) / UC Diagnoses  ? ?Final diagnoses:  ?Disorder of SI (sacroiliac) joint  ? ? ? ?Discharge Instructions   ? ?  ?You were seen today for pain in your back, likely from you sacroiliac joint.  ?I have sent out a steroid pack to help.  ?You may continue tylenol for pain.  ?I also recommend using an ice pack.  ?Please follow up with your primary care provider if you continue to have issues.  ? ? ? ?ED Prescriptions   ? ? Medication Sig Dispense Auth. Provider  ? methylPREDNISolone (MEDROL DOSEPAK) 4 MG TBPK tablet Take as directed 1 each Rondel Oh, MD  ? ?  ? ?PDMP not reviewed this encounter. ?  Rondel Oh, MD ?02/07/22 1203 ? ?

## 2022-02-13 NOTE — Progress Notes (Signed)
ANNUAL DIABETIC FOOT EXAM ? ?Subjective: ?UCHENNA RAPPAPORT presents today for annual diabetic foot examination. ? ?Patient relates 76 year h/o diabetes. ? ?Patient denies any h/o foot wounds. ? ?Patient has been diagnosed with neuropathy. ? ?Last HgA1c was 7.7%. Patient did not check blood glucose this morning. ? ?Risk factors: diabetes, diabetic neuropathy, HTN, CKD, hyperlipidemia, h/o tobacco use in remission. ? ?Patrick is patient's PCP. Last visit was March, 2023. ? ?Patient states he will be going to Urgent Care for left hip pain. ? ?Past Medical History:  ?Diagnosis Date  ? Arthritis   ? hands, knees  ? Carpal tunnel syndrome of right wrist   ? Diabetes mellitus without complication ([place])   ? Hypertension   ? Prostate cancer [facility]) 2007  ? radiation implant done at [facility] in [place]  ? ?Patient Active Problem List  ? Diagnosis Date Noted  ? Gastro-esophageal reflux disease with esophagitis, without bleeding 02/07/2022  ? Gout 02/07/2022  ? Incipient senile cataract 02/07/2022  ? 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 Onychomycosis 02/07/2022  ? Other ill-defined and unknown causes of morbidity and mortality 02/07/2022  ? Prostate cancer ([place]) 11/01/2021  ? CKD (chronic kidney disease) 11/01/2021  ? Low back pain 11/01/2021  ? Diabetic neuropathy ([place]) 11/01/2021  ? Diabetic retinopathy ([place]) 11/01/2021  ? Age-related nuclear cataract, bilateral 11/01/2021  ? Allergic rhinitis 11/01/2021  ? COVID-19 virus infection   ? Bacteremia   ? 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 ([place]) 11/29/2020  ? Carpal tunnel syndrome of right wrist 04/01/2019  ? 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 Diabetes mellitus without complication ([employer]) 04/18/9484  ? Decreased ROM of wrist 09/14/2015  ? Decreased sensation of hand and upper extremity 09/14/2015  ? Decreased strength, endurance, and mobility 09/14/2015  ? OTHER DISEASES OF LARYNX 03/24/2009  ? HYPERLIPIDEMIA 03/10/2009  ?  HYPERTENSION 03/10/2009  ? PULMONARY NODULE 03/10/2009  ? DYSPNEA ON EXERTION 03/10/2009  ? ?Past Surgical History:  ?Procedure Laterality Date  ? CARPAL TUNNEL RELEASE Right 08/18/2015  ? Procedure: RIGHT CARPAL TUNNEL RELEASE;  Surgeon: Daryll Brod, MD;  Location: [facility];  Service: Orthopedics;  Laterality: Right;  block in preop  ? INCISION AND DRAINAGE DEEP NECK ABSCESS    ? INSERTION PROSTATE RADIATION SEED  2007  ? at [place] in [place] Right 08/18/2015  ? Procedure:  DECOMPRESSION ULNAR NERVE RIGHT WRIST WITH DEBRIDEMENT ULNAR NERVE;  Surgeon: Daryll Brod, MD;  Location: [facility];  Service: Orthopedics;  Laterality: Right;  block in preop  ? ?Current Outpatient Medications on File Prior to Visit  ?Medication Sig Dispense Refill  ? amLODipine (NORVASC) 10 MG tablet Take 10 mg by mouth daily.    ? aspirin 81 MG tablet Take 81 mg by mouth daily.    ? atorvastatin (LIPITOR) 20 MG tablet Take 20 mg by mouth daily.    ? calcitRIOL (ROCALTROL) 0.25 MCG capsule Take 1 capsule by mouth daily.    ? empagliflozin (JARDIANCE) 25 MG TABS tablet Take 0.5 tablets by mouth daily.    ? ferrous sulfate 325 (65 FE) MG tablet Take 325 mg by mouth See admin instructions. Take one tablet on Monday, Wednesday, and Friday    ? insulin glargine (LANTUS) 100 UNIT/ML Solostar Pen Inject 38 Units into the skin in the morning.    ? metoprolol (LOPRESSOR) 50 MG tablet Take 50 mg by mouth 2 (two) times  daily.    ? NON FORMULARY [facility] ? ?ANTIFUNGAL (NAIL)-#1    ? ondansetron (ZOFRAN) 8 MG tablet Take 8 mg by mouth every 8 (eight) hours as needed.    ? terazosin (HYTRIN) 10 MG capsule Take 10 mg by mouth at bedtime.    ? ?No current facility-administered medications on file prior to visit.  ?  ?Allergies  ?Allergen Reactions  ? Cholecalciferol   ?  Other reaction(s): Constipation  ? Glucophage [Metformin] Diarrhea  ? Drug Class [Niacin And Related] Rash  ? Niacin   ?   Other reaction(s): Flushing  ? ?Social History  ? ?Occupational History  ? Not on file  ?Tobacco Use  ? Smoking status: Former  ? Smokeless tobacco: Never  ?Vaping Use  ? Vaping Use: Never used  ?Substance and Sexual Activity  ? Alcohol use: No  ? Drug use: No  ? Sexual activity: Not on file  ? ?Family History  ?Family history unknown: Yes  ? ? ?There is no immunization history on file for this patient.  ? ?Review of Systems: Negative except as noted in the HPI.  ? ?Objective: ?There were no vitals filed for this visit. ? ?KOUPER SPINELLA is a pleasant 76 y.o. male in NAD. AAO X 3. ? ?Vascular Examination: ?CFT <3 seconds b/l LE. Palpable DP/PT pulses b/l LE. Digital hair absent b/l. Skin temperature gradient WNL b/l. No pain with calf compression b/l. No edema noted b/l. No cyanosis or clubbing noted b/l LE. ? ?Dermatological Examination: ?Pedal integument with normal turgor, texture and tone b/l LE. No open wounds b/l. No interdigital macerations b/l. Toenails 1-5 b/l elongated, thickened, discolored with subungual debris. +Tenderness with dorsal palpation of nailplates. Porokeratotic lesion(s) noted submet head 1 right foot. ? ?Neurological Examination: ?Protective sensation decreased with 10 gram monofilament b/l. Vibratory sensation intact b/l. Proprioception intact bilaterally. ? ?Musculoskeletal Examination: ?Normal muscle strength 5/5 to all lower extremity muscle groups bilaterally. HAV with bunion deformity noted b/l LE. Hammertoe deformity noted 2-5 b/l. Pes planus deformity noted bilateral LE. No pain, crepitus or joint limitation noted with ROM b/l LE.  Patient ambulates independently without assistive aids. ? ?Footwear Assessment: ?Does the patient wear appropriate shoes? Yes. ?Does the patient need inserts/orthotics? Yes. ? ?Assessment: ?1. Pain due to onychomycosis of toenails of both feet   ?2. Callus   ?3. Hallux valgus, acquired, bilateral   ?4. Pes planus of both feet   ?5. Type II diabetes  mellitus with neurological manifestations ([place])   ?6. Encounter for diabetic foot exam ([place])   ?  ?ADA Risk Categorization: ?High Risk  ?Patient has one or more of the following: ?Loss of protective sensation ?Absent pedal pulses ?Severe Foot deformity ?History of foot ulcer ? ?Plan: ?-Patient was evaluated and treated. All patient's and/or POA's questions/concerns answered on today's visit. ?-Diabetic foot examination performed today. ?-Continue foot and shoe inspections daily. Monitor blood glucose per PCP/Endocrinologist's recommendations. ?-Toenails 1-5 b/l were debrided in length and girth with sterile nail nippers and dremel without iatrogenic bleeding.  ?-Painful porokeratotic lesion(s) submet head 1 right foot pared and enucleated with sterile scalpel blade without incident. Total number of lesions debrided=1. ?-Patient/POA to call should there be question/concern in the interim. ?Return in about 3 months (around 05/09/2022). ? ?Marzetta Board, DPM ?

## 2022-02-19 IMAGING — DX DG CHEST 1V
1 series · 1 of 1 positions shown · non-contrast
Comparison: None.

CLINICAL DATA: [id number] positive.

EXAM:
CHEST  1 VIEW

[chest ap]
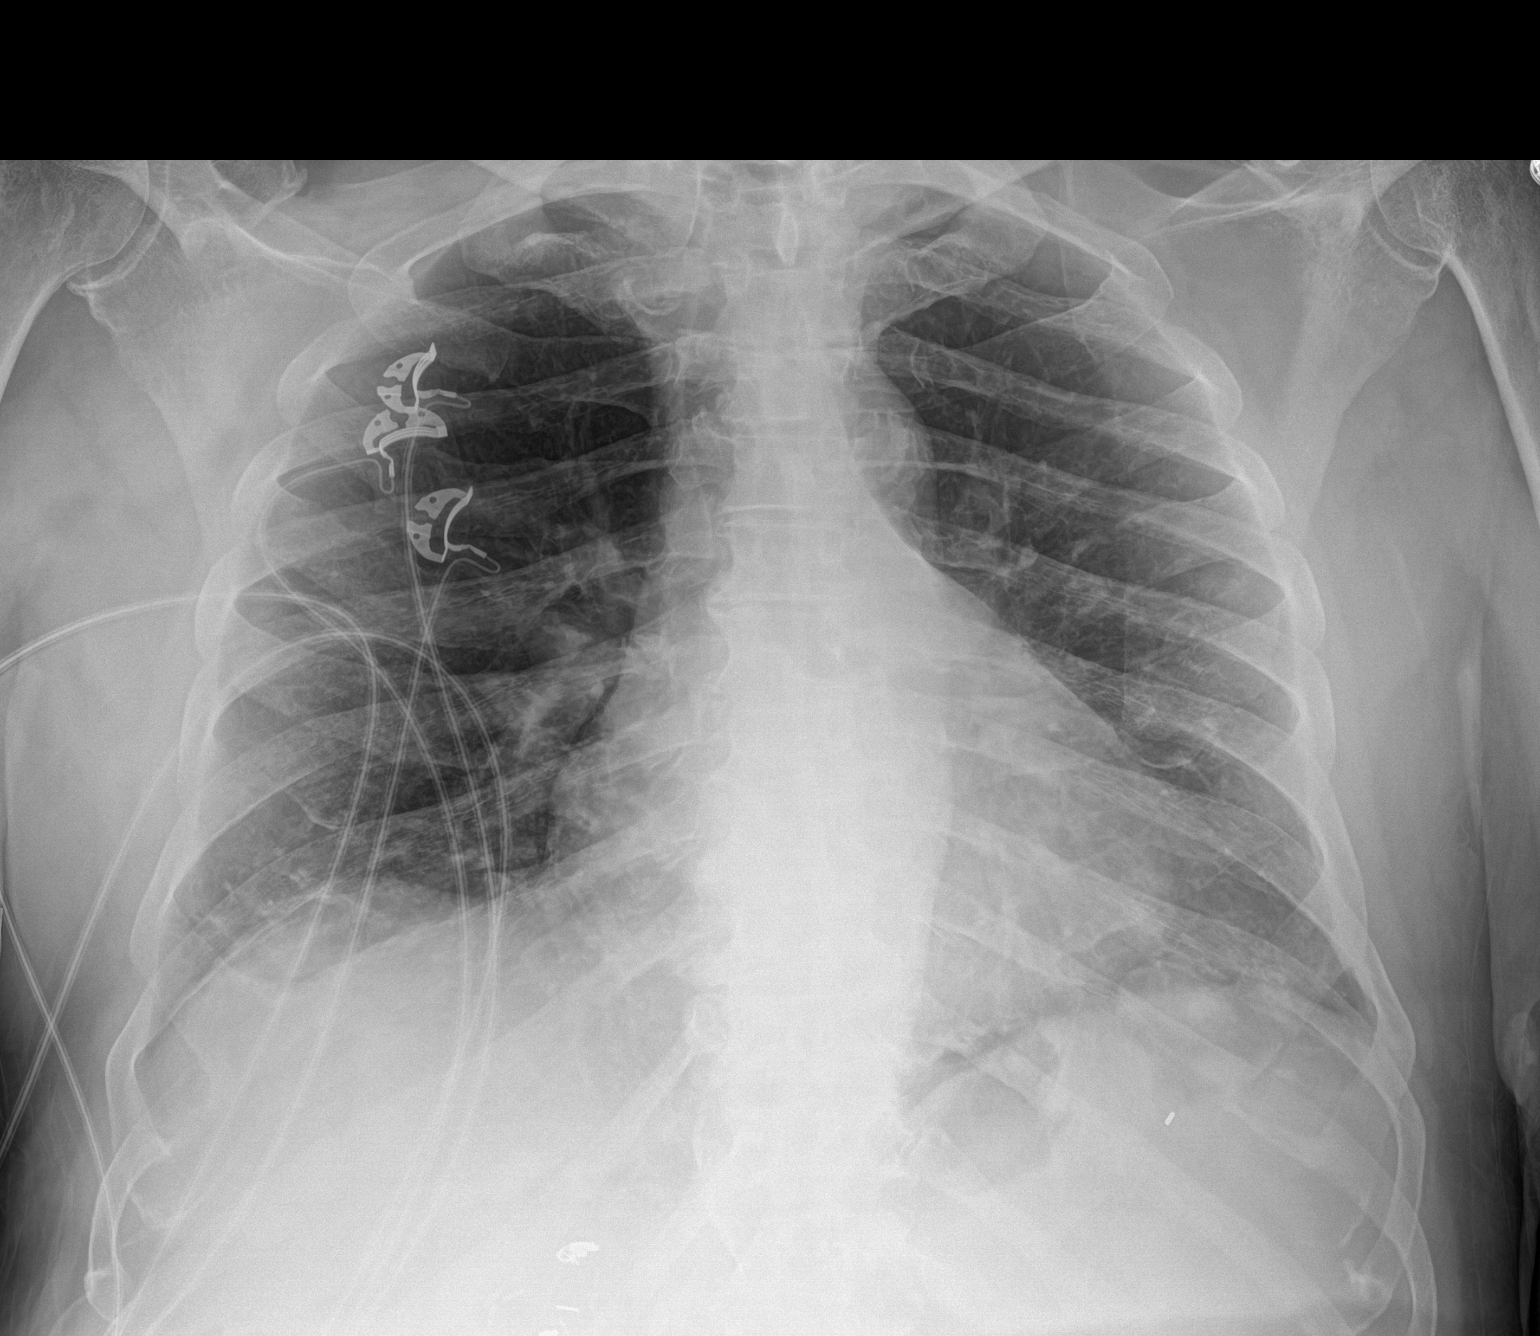

[1 of 1 positions shown; findings below may reference images not displayed]

FINDINGS: The heart size and mediastinal contours are within normal limits. No
pneumothorax or pleural effusion is noted. Minimal bibasilar
subsegmental atelectasis or scarring is noted. The visualized
skeletal structures are unremarkable.
IMPRESSION: Minimal bibasilar subsegmental atelectasis or scarring.

## 2022-05-11 ENCOUNTER — Encounter: Payer: Self-pay | Admitting: Podiatry

## 2022-05-11 ENCOUNTER — Other Ambulatory Visit: Payer: Self-pay | Admitting: Podiatry

## 2022-05-11 ENCOUNTER — Ambulatory Visit (INDEPENDENT_AMBULATORY_CARE_PROVIDER_SITE_OTHER): Payer: Medicare Other | Admitting: Podiatry

## 2022-05-11 ENCOUNTER — Ambulatory Visit (INDEPENDENT_AMBULATORY_CARE_PROVIDER_SITE_OTHER): Payer: Medicare Other

## 2022-05-11 DIAGNOSIS — E08621 Diabetes mellitus due to underlying condition with foot ulcer: Secondary | ICD-10-CM

## 2022-05-11 DIAGNOSIS — B351 Tinea unguium: Secondary | ICD-10-CM | POA: Diagnosis not present

## 2022-05-11 DIAGNOSIS — M79675 Pain in left toe(s): Secondary | ICD-10-CM

## 2022-05-11 DIAGNOSIS — E1149 Type 2 diabetes mellitus with other diabetic neurological complication: Secondary | ICD-10-CM | POA: Diagnosis not present

## 2022-05-11 DIAGNOSIS — L97412 Non-pressure chronic ulcer of right heel and midfoot with fat layer exposed: Secondary | ICD-10-CM

## 2022-05-11 DIAGNOSIS — M79674 Pain in right toe(s): Secondary | ICD-10-CM

## 2022-05-11 MED ORDER — DOXYCYCLINE HYCLATE 100 MG PO CAPS: ORAL_CAPSULE | ORAL | 0 refills | Status: DC

## 2022-05-11 NOTE — Progress Notes (Signed)
Subjective:  Patient ID: Ernest Walker, male    DOB: 1946/07/05,  MRN: [id number]  Ernest Walker presents to clinic today for at risk foot care with history of diabetic neuropathy and painful porokeratotic lesion(s) right foot and painful mycotic toenails that limit ambulation. Painful toenails interfere with ambulation. Aggravating factors include wearing enclosed shoe gear. Pain is relieved with periodic professional debridement. Painful porokeratotic lesions are aggravated when weightbearing with and without shoegear. Pain is relieved with periodic professional debridement.  Patient states blood glucose was 169 mg/dl today.  Last known HgA1c was unknown.    He states he has been wearing leather shoes to church.  PCP is [facility] , and last visit was  3-4 months ago.  Allergies  Allergen Reactions   Cholecalciferol     Other reaction(s): Constipation   Glucophage [Metformin] Diarrhea   Drug Class [Niacin And Related] Rash   Niacin     Other reaction(s): Flushing    Review of Systems: Negative except as noted in the HPI.  Objective:   Ernest Walker is a pleasant 76 y.o. male in NAD. AAO X 3.  Vascular Examination: CFT <3 seconds b/l LE. Palpable DP/PT pulses b/l LE. Digital hair absent b/l. Skin temperature gradient WNL b/l. No pain with calf compression b/l. No edema noted b/l. No cyanosis or clubbing noted b/l LE.  Dermatological Examination: Pedal integument with normal turgor, texture and tone b/l LE. No interdigital macerations b/l. Toenails 1-5 b/l elongated, thickened, discolored with subungual debris. +Tenderness with dorsal palpation of nailplates.     Wound Location: submet head 1 right foot There is a minimal amount of devitalized tissue present in the wound. Predebridement Wound Measurement:  0.3  x 0.3 x 0 cm. Postdebridement Wound Measurement: 0.3 x 0.3 x  0.2 cm. No odor, no tracking, no tunneling of ulcer. Wound Base:  Granular/Healthy Peri-wound: Macerated Exudate: Scant/small amount Bloody exudate Blood Loss during debridement: < 1/2 cc('s). Material in wound which inhibits healing/promotes adjacent tissue breakdown:  macerated hyperkeratosis. Description of tissue removed from ulceration today:  necrotic tissue. Sign(s) of clinical bacterial infection: no clinical signs of infection noted on examination today.   Neurological Examination: Protective sensation decreased with 10 gram monofilament b/l. Vibratory sensation intact b/l. Proprioception intact bilaterally.  Musculoskeletal Examination: Normal muscle strength 5/5 to all lower extremity muscle groups bilaterally. HAV with bunion deformity noted b/l LE. Hammertoe deformity noted 2-5 b/l. Pes planus deformity noted bilateral LE. No pain, crepitus or joint limitation noted with ROM b/l LE.  Patient ambulates independently without assistive aids.  Xray findings right lower extremity: No gas in tissues right foot. Vessel calcifications noted right foot. No bone erosion noted at location of ulceration both feet. No evidence of fracture right foot. Pes planus foot deformity right foot. No foreign body evident right foot.   Assessment/Plan: 1. Pain due to onychomycosis of toenails of both feet   2. Diabetic ulcer of right midfoot associated with diabetes mellitus due to underlying condition, with fat layer exposed ([place])   3. Type II diabetes mellitus with neurological manifestations ([place])   Plan: -Patient was evaluated and treated and all questions answered.  -Patient/POA/Family member educated on diagnosis and treatment plan of routine ulcer debridement/wound care.  -Ulceration debridement achieved utilizing sharp excisional debridement with sterile scalpel blade and sterile currette.. Type/amount of devitalized tissue removed: necrotic tissue -Today's ulcer size post-debridement: 0.3 x 0.3 x 0.2 cm. -Ulceration cleansed with wound cleanser.  Iodosorb Gel applied to  base of ulceration and secured with light dressing. -Wound responded well to today's debridement. -Patient risk factors affecting healing of ulcer: diabetes, diabetic neuropathy, HTN, CKD, hyperlipidemia, h/o tobacco use in remission, history of prostate cancer -Thedora Hinders given written instructions on daily wound care for submet head 1 right foot ulceration. -Rx for Doxycyline 100 mg, #14, to be taken one capsule twice daily for 7 days. -Frequency of debridements needed to achieve healing: weekly to biweekly -Wound culture and sensitivity ordered for right foot ulceration today. -Radiology ordered today: X-Ray: right foot, 3 views. -Ordered noninvasive arterial studies ABIs with and without TBIs for b/l lower extremities. -Dispensed Darco shoe for right foot.Patient/POA signed ABN for DME. -Mycotic toenails 1-5 bilaterally were debrided in length and girth with sterile nail nippers and dremel without incident. -Patient scheduled to see Dr. Lanae Crumbly in one week for follow up of diabetic ulcer submet head 1 right foot. Follow up with me in 9 weeks for diabetic foot care. -Patient/POA to call should there be question/concern in the interim.  Marzetta Board, DPM

## 2022-05-11 NOTE — Addendum Note (Signed)
Addended by: Zebedee Iba on: 05/11/2022 01:59 PM   Modules accepted: Orders

## 2022-05-11 NOTE — Patient Instructions (Signed)
DRESSING CHANGES RIGHT FOOT:   PHARMACY SHOPPING LIST: Saline or Wound Cleanser for cleaning wound 2 x 2 inch sterile gauze for cleaning wound  A. IF DISPENSED, WEAR SURGICAL SHOE OR WALKING BOOT AT ALL TIMES.  B. IF PRESCRIBED ORAL ANTIBIOTICS, TAKE ALL MEDICATION AS PRESCRIBED UNTIL ALL ARE GONE.  C. IF DOCTOR HAS DESIGNATED NONWEIGHTBEARING STATUS, PLEASE ADHERE TO INSTRUCTIONS.  1. KEEP RIGHT FOOT DRY AT ALL TIMES!!!!  2. CLEANSE ULCER WITH SALINE OR WOUND CLEANSER.  3. DAB DRY WITH GAUZE SPONGE.  4. APPLY A LIGHT AMOUNT OF MEDIHONEY GEL TO BASE OF ULCER.  5. APPLY OUTER DRESSING AS INSTRUCTED.  6. WEAR SURGICAL SHOE/BOOT DAILY AT ALL TIMES. IF SUPPLIED, WEAR HEEL PROTECTORS AT ALL TIMES WHEN IN BED.  7. DO NOT WALK BAREFOOT!!!  8.  IF YOU EXPERIENCE ANY FEVER, CHILLS, NIGHTSWEATS, NAUSEA OR VOMITING, ELEVATED OR LOW BLOOD SUGARS, REPORT TO EMERGENCY ROOM.  9. IF YOU EXPERIENCE INCREASED REDNESS, PAIN, SWELLING, DISCOLORATION, ODOR, PUS, DRAINAGE OR WARMTH OF YOUR FOOT, REPORT TO EMERGENCY ROOM.

## 2022-05-15 LAB — WOUND CULTURE
MICRO NUMBER:: 13669471
RESULT:: NO GROWTH
SPECIMEN QUALITY:: ADEQUATE

## 2022-05-15 LAB — HOUSE ACCOUNT TRACKING

## 2022-05-19 ENCOUNTER — Ambulatory Visit (INDEPENDENT_AMBULATORY_CARE_PROVIDER_SITE_OTHER): Payer: No Typology Code available for payment source | Admitting: Podiatry

## 2022-05-19 DIAGNOSIS — E08621 Diabetes mellitus due to underlying condition with foot ulcer: Secondary | ICD-10-CM

## 2022-05-19 DIAGNOSIS — L97411 Non-pressure chronic ulcer of right heel and midfoot limited to breakdown of skin: Secondary | ICD-10-CM

## 2022-05-19 NOTE — Progress Notes (Signed)
  Subjective:  Patient ID: Ernest Walker, male    DOB: Oct 18, 1946,  MRN: [id number]  Chief Complaint  Patient presents with   Diabetic Ulcer     ONE WEEK FOR DIABETIC ULCER RIGHT FOOT    76 y.o. male presents with the above complaint. History confirmed with patient.  He says it is doing well his wife has been applying the ointment and bandage he says he has not had any drainage  Objective:  Physical Exam: warm, good capillary refill, no trophic changes or ulcerative lesions, normal DP and PT pulses, abnormal sensory exam, and small submetatarsal 1 right foot ulcer measuring 2 x 2 mm, 1 mm depth, no signs of infection     Assessment:   1. Diabetic ulcer of right midfoot associated with diabetes mellitus due to underlying condition, limited to breakdown of skin [facility])      Plan:  Patient was evaluated and treated and all questions answered.  I read the wound overlying hyperkeratosis.  Expect this to heal uneventfully.  He will finish his doxycycline prescription and is applying the antibiotic ointment that he has.  I placed a dancers pad to offload in his diabetic insole.  He has a surgical boot at home to offload the ulcer further.  I will see him back in 3 weeks for reevaluation  Return in about 3 weeks (around 06/09/2022) for wound care.

## 2022-06-08 ENCOUNTER — Ambulatory Visit (HOSPITAL_COMMUNITY)
Admission: RE | Admit: 2022-06-08 | Discharge: 2022-06-08 | Disposition: A | Discharge status: Home / Self Care | Payer: No Typology Code available for payment source | Source: Ambulatory Visit | Attending: Podiatry | Admitting: Podiatry

## 2022-06-08 DIAGNOSIS — E08621 Diabetes mellitus due to underlying condition with foot ulcer: Secondary | ICD-10-CM | POA: Diagnosis present

## 2022-06-08 DIAGNOSIS — L97412 Non-pressure chronic ulcer of right heel and midfoot with fat layer exposed: Secondary | ICD-10-CM

## 2022-06-09 ENCOUNTER — Ambulatory Visit (INDEPENDENT_AMBULATORY_CARE_PROVIDER_SITE_OTHER): Payer: No Typology Code available for payment source | Admitting: Podiatry

## 2022-06-09 DIAGNOSIS — E08621 Diabetes mellitus due to underlying condition with foot ulcer: Secondary | ICD-10-CM | POA: Diagnosis not present

## 2022-06-09 DIAGNOSIS — L97411 Non-pressure chronic ulcer of right heel and midfoot limited to breakdown of skin: Secondary | ICD-10-CM

## 2022-06-09 NOTE — Progress Notes (Signed)
  Subjective:  Patient ID: Ernest Walker, male    DOB: [birth year],  MRN: [id number]  Chief Complaint  Patient presents with   Diabetic Ulcer    3 week follow up right foot ulcer    76 y.o. male presents with the above complaint. History confirmed with patient.  He is doing well  Objective:  Physical Exam: warm, good capillary refill, no trophic changes or ulcerative lesions, normal DP and PT pulses, abnormal sensory exam, and small submetatarsal 1 right foot ulcer has healed with immature skin present     Assessment:   1. Diabetic ulcer of right midfoot associated with diabetes mellitus due to underlying condition, limited to breakdown of skin [facility])       Plan:  Patient was evaluated and treated and all questions answered.  Wound fully healed at this point with immature skin present.  Continue wearing offloading pad for now.  We will reevaluate in 4 weeks to see if there is any recurrence.  If there is recurrence or continues to have persistent issues here may consider possible reduction of the sesamoid bones which are prominent beneath this.  Return in about 4 weeks (around 07/07/2022) for wound check .

## 2022-07-07 ENCOUNTER — Ambulatory Visit (INDEPENDENT_AMBULATORY_CARE_PROVIDER_SITE_OTHER): Payer: No Typology Code available for payment source | Admitting: Podiatry

## 2022-07-07 DIAGNOSIS — L97411 Non-pressure chronic ulcer of right heel and midfoot limited to breakdown of skin: Secondary | ICD-10-CM

## 2022-07-07 DIAGNOSIS — E08621 Diabetes mellitus due to underlying condition with foot ulcer: Secondary | ICD-10-CM | POA: Diagnosis not present

## 2022-07-10 ENCOUNTER — Encounter: Payer: Self-pay | Admitting: Podiatry

## 2022-07-10 NOTE — Progress Notes (Signed)
  Subjective:  Patient ID: Ernest Walker, male    DOB: 11-Jul-1946,  MRN: [id number]  Chief Complaint  Patient presents with   Diabetic Ulcer    4 week follow up diabetic ulcer right foot    76 y.o. male presents with the above complaint. History confirmed with patient.  He is doing well  Objective:  Physical Exam: warm, good capillary refill, no trophic changes or ulcerative lesions, normal DP and PT pulses, abnormal sensory exam, and small submetatarsal 1 right foot ulcer remains healed      Assessment:   1. Diabetic ulcer of right midfoot associated with diabetes mellitus due to underlying condition, limited to breakdown of skin [facility])       Plan:  Patient was evaluated and treated and all questions answered.  His ulcer remains healed I discussed the possibility and risk of recurrence and treatment options to prevent this including sesamoid shaved.  He will return as needed if it worsens or returns  Return if symptoms worsen or fail to improve.

## 2022-08-17 ENCOUNTER — Ambulatory Visit: Payer: Medicare Other | Admitting: Podiatry

## 2022-08-23 ENCOUNTER — Ambulatory Visit (INDEPENDENT_AMBULATORY_CARE_PROVIDER_SITE_OTHER): Payer: Medicare Other | Admitting: Podiatry

## 2022-08-23 ENCOUNTER — Encounter: Payer: Self-pay | Admitting: Podiatry

## 2022-08-23 DIAGNOSIS — M79675 Pain in left toe(s): Secondary | ICD-10-CM | POA: Diagnosis not present

## 2022-08-23 DIAGNOSIS — M79674 Pain in right toe(s): Secondary | ICD-10-CM

## 2022-08-23 DIAGNOSIS — B351 Tinea unguium: Secondary | ICD-10-CM

## 2022-08-23 DIAGNOSIS — L84 Corns and callosities: Secondary | ICD-10-CM | POA: Diagnosis not present

## 2022-08-23 DIAGNOSIS — E1149 Type 2 diabetes mellitus with other diabetic neurological complication: Secondary | ICD-10-CM | POA: Diagnosis not present

## 2022-08-23 NOTE — Progress Notes (Signed)
  Subjective:  Patient ID: Ernest Walker, male    DOB: 12-31-1945,  MRN: [id number]  Ernest Walker presents to clinic today for at risk foot care with history of diabetic neuropathy and preulcerative lesion(s) submet head 1 right foot and painful mycotic toenails that limit ambulation. Painful toenails interfere with ambulation. Aggravating factors include wearing enclosed shoe gear. Pain is relieved with periodic professional debridement. Painful porokeratotic lesions are aggravated when weightbearing with and without shoegear. Pain is relieved with periodic professional debridement. Patient states ulcer has healed per Dr. Sherryle Lis. He voices no new pedal problems on today's visit.   Chief Complaint  Patient presents with   Nail Problem    Dfc BG - 140 this morning A1C - pt does not recall  PCP - Dr Mattie Marlin    New problem(s): None.  PCP is [facility].  Allergies  Allergen Reactions   Cholecalciferol     Other reaction(s): Constipation   Glucophage [Metformin] Diarrhea   Drug Class [Niacin And Related] Rash   Niacin     Other reaction(s): Flushing    Review of Systems: Negative except as noted in the HPI.  Objective:  Ernest Walker is a pleasant 76 y.o. male WD, WN in NAD. AAO x 3.  Vascular Examination: CFT <3 seconds b/l LE. Palpable DP/PT pulses b/l LE. Digital hair absent b/l. Skin temperature gradient WNL b/l. No pain with calf compression b/l. No edema noted b/l. No cyanosis or clubbing noted b/l LE.  Dermatological Examination: Pedal integument with normal turgor, texture and tone b/l LE. No open wounds b/l. No interdigital macerations b/l. Toenails 1-5 b/l elongated, thickened, discolored with subungual debris. +Tenderness with dorsal palpation of nailplates.   Preulcerative lesion noted submet head 1 right foot. There is visible subdermal hemorrhage. There is no surrounding erythema, no edema, no drainage, no odor, no fluctuance. Postdebridement, reveals no  open wound on today's visit.      Neurological Examination: Protective sensation decreased with 10 gram monofilament b/l. Vibratory sensation intact b/l. Proprioception intact bilaterally.  Musculoskeletal Examination: Normal muscle strength 5/5 to all lower extremity muscle groups bilaterally. HAV with bunion deformity noted b/l LE. Hammertoe deformity noted 2-5 b/l. Pes planus deformity noted bilateral LE. No pain, crepitus or joint limitation noted with ROM b/l LE.  Patient ambulates independently without assistive aids.  Assessment/Plan: 1. Pain due to onychomycosis of toenails of both feet   2. Pre-ulcerative calluses   3. Type II diabetes mellitus with neurological manifestations ([place])     No orders of the defined types were placed in this encounter.   -Consent given for treatment as described below: -Examined patient. -Continue diabetic foot care principles: inspect feet daily, monitor glucose as recommended by PCP and/or Endocrinologist, and follow prescribed diet per PCP, Endocrinologist and/or dietician. -Mycotic toenails 1-5 bilaterally were debrided in length and girth with sterile nail nippers and dremel without incident. -Preulcerative lesion pared submet head 1 right foot utilizing sterile scalpel blade. Total number pared=1. -Patient/POA to call should there be question/concern in the interim.   Return in about 2 months (around 11/04/2022).  Marzetta Board, DPM

## 2022-10-20 ENCOUNTER — Ambulatory Visit (HOSPITAL_COMMUNITY)
Admission: EM | Admit: 2022-10-20 | Discharge: 2022-10-20 | Disposition: A | Discharge status: Home / Self Care | Payer: No Typology Code available for payment source | Attending: Physician Assistant | Admitting: Physician Assistant

## 2022-10-20 ENCOUNTER — Encounter (HOSPITAL_COMMUNITY): Payer: Self-pay | Admitting: Emergency Medicine

## 2022-10-20 ENCOUNTER — Ambulatory Visit (INDEPENDENT_AMBULATORY_CARE_PROVIDER_SITE_OTHER): Payer: No Typology Code available for payment source

## 2022-10-20 DIAGNOSIS — J441 Chronic obstructive pulmonary disease with (acute) exacerbation: Secondary | ICD-10-CM

## 2022-10-20 DIAGNOSIS — R059 Cough, unspecified: Secondary | ICD-10-CM | POA: Diagnosis not present

## 2022-10-20 DIAGNOSIS — M79641 Pain in right hand: Secondary | ICD-10-CM | POA: Diagnosis not present

## 2022-10-20 DIAGNOSIS — J329 Chronic sinusitis, unspecified: Secondary | ICD-10-CM | POA: Diagnosis not present

## 2022-10-20 DIAGNOSIS — M19041 Primary osteoarthritis, right hand: Secondary | ICD-10-CM | POA: Diagnosis not present

## 2022-10-20 DIAGNOSIS — J40 Bronchitis, not specified as acute or chronic: Secondary | ICD-10-CM

## 2022-10-20 MED ORDER — AMOXICILLIN-POT CLAVULANATE 250-125 MG PO TABS: 1.0000 | ORAL_TABLET | Freq: Two times a day (BID) | ORAL | 0 refills | Status: DC

## 2022-10-20 NOTE — ED Provider Notes (Signed)
[facility]    CSN: [id number] Arrival date & time: 10/20/22  1159      History   Chief Complaint Chief Complaint  Patient presents with   Fatigue   loss of taste    HPI BABYBOY LOYA is a 76 y.o. male.   Patient presents today with a 8-day history of URI symptoms including congestion and cough.  Reports that he has had a decreased appetite and has been eating very little as a result of symptoms.  Denies any abdominal pain, nausea, vomiting, diarrhea.  Denies any shortness of breath, cough, fever.  He has tried NyQuil and DayQuil without improvement of symptoms.  Denies any known sick contacts.  He has had COVID approximately 3 years ago.  He has had COVID and influenza vaccines.  Denies history of asthma, COPD.  He is a former smoker quit many years ago.  Denies any recent antibiotics or steroids.  Does report symptoms have gradually been improving but have not resolved prompting evaluation.  In addition, patient reports a 24-hour history of right hand pain.  He denies any known injury but reports that symptoms began after he woke up and wonders if he might of injured it in his sleep.  He does report some ongoing numbness but this is at baseline as he has a history of carpal tunnel and had release surgery several years ago that has left a ongoing numbness in his first 3 fingers.  He is right-handed.  Denies any weakness or paresthesias.  He has tried Tylenol without improvement of symptoms.  Reports pain is rated 8 on a 0-10 pain scale, described as aching, worse at first metacarpal, no alleviating factors identified.    Past Medical History:  Diagnosis Date   Arthritis    hands, knees   Carpal tunnel syndrome of right wrist    Diabetes mellitus without complication ([place])    Hypertension    Prostate cancer ([place]) 2007   radiation implant done at [facility] in [facility]    Patient Active Problem List   Diagnosis Date Noted   Gastro-esophageal reflux disease with esophagitis,  without bleeding 02/07/2022   Gout 02/07/2022   Incipient senile cataract 02/07/2022   Low-tension glaucoma, right eye, mild stage 02/07/2022   Onychomycosis 02/07/2022   Other ill-defined and unknown causes of morbidity and mortality 02/07/2022   Prostate cancer ([place]) 11/01/2021   CKD (chronic kidney disease) 11/01/2021   Low back pain 11/01/2021   Diabetic neuropathy ([place]) 11/01/2021   Diabetic retinopathy ([place]) 11/01/2021   Age-related nuclear cataract, bilateral 11/01/2021   Allergic rhinitis 11/01/2021   COVID-19 virus infection    Bacteremia    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 ([place]) 11/29/2020   Carpal tunnel syndrome of right wrist 04/01/2019   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   Diabetes mellitus without complication ([place]) 40/98/1191   Decreased ROM of wrist 09/14/2015   Decreased sensation of hand and upper extremity 09/14/2015   Decreased strength, endurance, and mobility 09/14/2015   OTHER DISEASES OF LARYNX 03/24/2009   HYPERLIPIDEMIA 03/10/2009   HYPERTENSION 03/10/2009   PULMONARY NODULE 03/10/2009   DYSPNEA ON EXERTION 03/10/2009    Past Surgical History:  Procedure Laterality Date   CARPAL TUNNEL RELEASE Right 08/18/2015   Procedure: RIGHT CARPAL TUNNEL RELEASE;  Surgeon: Daryll Brod, MD;  Location: [facility];  Service: Orthopedics;  Laterality: Right;  block in preop   INCISION AND DRAINAGE DEEP NECK ABSCESS  INSERTION PROSTATE RADIATION SEED  2007   at [place] in [place] Right 08/18/2015   Procedure:  DECOMPRESSION ULNAR NERVE RIGHT WRIST WITH DEBRIDEMENT ULNAR NERVE;  Surgeon: Daryll Brod, MD;  Location: [facility];  Service: Orthopedics;  Laterality: Right;  block in preop       Home Medications    Prior to Admission medications   Medication Sig Start Date End Date Taking? Authorizing Provider  amoxicillin-clavulanate (AUGMENTIN) 250-125 MG tablet Take 1 tablet  by mouth 2 (two) times daily. 10/20/22  Yes Zaid Tomes K, PA-C  amLODipine (NORVASC) 10 MG tablet TAKE ONE-HALF TABLET BY MOUTH EVERY DAY FOR BLOOD PRESSURE 04/12/22   [provider]  aspirin 81 MG chewable tablet CHEW ONE TABLET BY MOUTH EVERY DAY ASK YOUR DOCTOR HOW LONG YOU SHOULD TAKE THIS MEDICATION 04/12/22   [provider]  atorvastatin (LIPITOR) 20 MG tablet Take 20 mg by mouth daily.    [provider]  atorvastatin (LIPITOR) 80 MG tablet TAKE ONE-HALF TABLET BY MOUTH AT BEDTIME FOR CHOLESTEROL 10/12/21   [provider]  calcitRIOL (ROCALTROL) 0.25 MCG capsule Take 1 capsule by mouth 2 (two) times daily. 04/12/22   [provider]  empagliflozin (JARDIANCE) 25 MG TABS tablet Take 0.5 tablets by mouth daily. 09/29/20   [provider]  ferrous sulfate 325 (65 FE) MG tablet Take 325 mg by mouth See admin instructions. Take one tablet on Monday, Wednesday, and Friday 11/09/20   [provider]  insulin glargine-yfgn (SEMGLEE) 100 UNIT/ML Pen INJECT 38 UNITS UNDER SKIN EVERY MORNING BEFORE BREAKFAST FOR DIABETES.DISCARD PEN 28 DAYS AFTER OPENING**REPLACES LANTUS** 10/07/21   [provider]  lisinopril (ZESTRIL) 40 MG tablet TAKE ONE-HALF TABLET BY MOUTH EVERY DAY FOR BLOOD PRESSURE 04/12/22   [provider]  metoprolol (LOPRESSOR) 50 MG tablet Take 50 mg by mouth 2 (two) times daily.    [provider]  NON FORMULARY [facility]  ANTIFUNGAL (NAIL)-#1    [provider]  ondansetron (ZOFRAN) 8 MG tablet TAKE ONE-HALF TABLET BY MOUTH EVERY 8 HOURS AS NEEDED FOR NAUSEA 08/30/21   [provider]  pantoprazole (PROTONIX) 40 MG tablet TAKE ONE TABLET BY MOUTH ONCE DAILY BEFORE A MEAL TO CONTROL STOMACH ACID 04/01/22   [provider]  terazosin (HYTRIN) 10 MG capsule Take 1 capsule by mouth at bedtime. 04/12/22   [provider]    Family History Family History   Family history unknown: Yes    Social History Social History   Tobacco Use   Smoking status: Former   Smokeless tobacco: Never  Scientific laboratory technician Use: Never used  Substance Use Topics   Alcohol use: No   Drug use: No     Allergies   Cholecalciferol, Glucophage [metformin], Drug class [niacin and related], and Niacin   Review of Systems Review of Systems  Constitutional:  Positive for activity change, appetite change and fatigue. Negative for fever.  HENT:  Positive for congestion. Negative for sinus pressure, sneezing and sore throat.   Respiratory:  Positive for cough. Negative for shortness of breath.   Cardiovascular:  Negative for chest pain.  Gastrointestinal:  Negative for abdominal pain, diarrhea, nausea and vomiting.  Musculoskeletal:  Positive for arthralgias. Negative for myalgias.  Neurological:  Negative for weakness and numbness.     Physical Exam Triage Vital Signs ED Triage Vitals  Enc Vitals Group     BP 10/20/22 1512 Marland Kitchen)  129/59     Pulse Rate 10/20/22 1512 70     Resp 10/20/22 1512 15     Temp 10/20/22 1512 98.7 F (37.1 C)     Temp Source 10/20/22 1512 Oral     SpO2 10/20/22 1512 100 %     Weight --      Height --      Head Circumference --      Peak Flow --      Pain Score 10/20/22 1510 0     Pain Loc --      Pain Edu? --      Excl. in [place]? --    No data found.  Updated Vital Signs BP (!) 129/59 (BP Location: Left Arm)   Pulse 70   Temp 98.7 F (37.1 C) (Oral)   Resp 15   SpO2 100%   Visual Acuity Right Eye Distance:   Left Eye Distance:   Bilateral Distance:    Right Eye Near:   Left Eye Near:    Bilateral Near:     Physical Exam Vitals reviewed.  Constitutional:      General: He is awake.     Appearance: Normal appearance. He is well-developed. He is not ill-appearing.     Comments: Very pleasant male appears stated age in no acute distress sitting comfortably in exam room   HENT:     Head: Normocephalic and  atraumatic.     Right Ear: Tympanic membrane, ear canal and external ear normal. Tympanic membrane is not erythematous or bulging.     Left Ear: Tympanic membrane, ear canal and external ear normal. Tympanic membrane is not erythematous or bulging.     Nose: Nose normal.     Mouth/Throat:     Pharynx: Uvula midline. No oropharyngeal exudate or posterior oropharyngeal erythema.  Cardiovascular:     Rate and Rhythm: Normal rate and regular rhythm.     Heart sounds: Normal heart sounds, S1 normal and S2 normal. No murmur heard. Pulmonary:     Effort: Pulmonary effort is normal. No accessory muscle usage or respiratory distress.     Breath sounds: No stridor. Examination of the right-lower field reveals decreased breath sounds. Examination of the left-lower field reveals decreased breath sounds. Decreased breath sounds present. No wheezing, rhonchi or rales.  Musculoskeletal:     Right hand: Tenderness and bony tenderness present. No swelling or deformity. Normal range of motion. There is no disruption of two-point discrimination. Normal capillary refill.     Comments: Right hand: Tenderness palpation over first metacarpal without deformity.  Normal active range of motion and 80% fist formation.  Decreased sensation in first 4 digits at baseline otherwise neurovascularly intact.  Neurological:     Mental Status: He is alert.  Psychiatric:        Behavior: Behavior is cooperative.      UC Treatments / Results  Labs (all labs ordered are listed, but only abnormal results are displayed) Labs Reviewed - No data to display  EKG   Radiology DG Chest 2 View  Result Date: 10/20/2022 CLINICAL DATA:  Worsening cough EXAM: CHEST - 2 VIEW COMPARISON:  11/29/2020 FINDINGS: Cardiac size is within normal limits. There are no signs of pulmonary edema or focal pulmonary consolidation. There is blunting of left lateral CP angle. There is no pneumothorax. Surgical clips are seen in upper abdomen.  Degenerative changes are noted in right shoulder and right AC joints. IMPRESSION: There are no signs of pulmonary edema or focal  pulmonary consolidation. Blunting of left lateral CP angle may be due to pleural thickening or minimal effusion. Electronically Signed   By: Elmer Picker M.D.   On: 10/20/2022 16:13   DG Hand Complete Right  Result Date: 10/20/2022 CLINICAL DATA:  Pain EXAM: RIGHT HAND - COMPLETE 3+ VIEW COMPARISON:  None Available. FINDINGS: No recent fracture or dislocation is seen. Moderate to severe degenerative changes are noted in first carpometacarpal and first metacarpophalangeal joints. There are small bony spurs in the interphalangeal joints, more prominent in the thumb. Arterial calcifications are seen in soft tissues. Degenerative changes with bony spurs are noted in the right wrist. IMPRESSION: No recent fracture or dislocation is seen. Moderate to severe degenerative changes are noted in first carpometacarpal joint and first metacarpophalangeal joint. Degenerative changes are noted in interphalangeal joints and wrist. Arteriosclerosis. Electronically Signed   By: Elmer Picker M.D.   On: 10/20/2022 16:11    Procedures Procedures (including critical care time)  Medications Ordered in UC Medications - No data to display  Initial Impression / Assessment and Plan / UC Course  I have reviewed the triage vital signs and the nursing notes.  Pertinent labs & imaging results that were available during my care of the patient were reviewed by me and considered in my medical decision making (see chart for details).     Patient is well-appearing, afebrile, nontoxic, nontachycardic, with oxygen saturation of 100%.  Chest x-ray was obtained as he had decreased aeration on exam that showed no evidence of consolidation but did show blunting of costophrenic angle.  Given his history of smoking and likely COPD will cover for COPD exacerbation given persistent/worsening cough.   He was started on Augmentin 250/125 twice daily for 10 days.  This medication was dose adjusted based on his metabolic panel in care everywhere from 08/25/2022 with creatinine of 3.5 and calculated creatinine clearance of 21.2 mL/min.  Recommended he use over-the-counter medications for additional symptom relief.  If his symptoms or not improving or if anything worsens he needs to be seen immediately.  Recommend close follow-up with his primary care to which he expressed understanding.   X-ray of hand was obtained that showed degenerative changes without acute osseous abnormality.  Given his history of chronic kidney disease and diabetes we are limited in the medications that can be used.  Recommended that he use Tylenol 650 mg every 6-8 hours as needed.  He was placed in a brace for comfort and support.  Recommended heat for additional symptom relief.  Encouraged him to follow-up with primary care as he may benefit from referral to orthopedics if symptoms persist.  Strict return precautions given.  Final Clinical Impressions(s) / UC Diagnoses   Final diagnoses:  Sinobronchitis  Arthritis of right hand     Discharge Instructions      There is no evidence of pneumonia.  Start Augmentin twice daily for 10 days.  Make sure you are drinking plenty of fluid.  Use over-the-counter medications as needed.  If you have any worsening symptoms including worsening cough, shortness of breath, chest pain, fever you need to be seen immediately.  Your x-ray showed arthritis in your hand.  Use Tylenol 650 mg every 6-8 hours as needed.  This is to regular Tylenol per dose.  Use the brace to help provide comfort and support.  [facility] or not [facility]  If anything worsens you have increased pain, redness, swelling, fever you should be seen immediately.  ED Prescriptions     Medication Sig Dispense Auth. Provider   amoxicillin-clavulanate (AUGMENTIN) 250-125 MG tablet Take 1  tablet by mouth 2 (two) times daily. 20 tablet Jakaiya Netherland, Derry Skill, PA-C      PDMP not reviewed this encounter.   Terrilee Croak, PA-C 10/20/22 1641

## 2022-10-20 NOTE — Discharge Instructions (Addendum)
There is no evidence of pneumonia.  Start Augmentin twice daily for 10 days.  Make sure you are drinking plenty of fluid.  Use over-the-counter medications as needed.  If you have any worsening symptoms including worsening cough, shortness of breath, chest pain, fever you need to be seen immediately.  Your x-ray showed arthritis in your hand.  Use Tylenol 650 mg every 6-8 hours as needed.  This is to regular Tylenol per dose.  Use the brace to help provide comfort and support.  [facility] if [facility] or not improving.  If anything worsens you have increased pain, redness, swelling, fever you should be seen immediately.

## 2022-10-20 NOTE — ED Triage Notes (Signed)
Wednesday last week pt still doesn't have any energy or taste.  Pt had been coughing, sinus headaches-now better. Still has little cough.   Pt reports slept on right hand it is tender started couple days ago.  Pt reports right knee wants to give out since last night.

## 2022-10-23 ENCOUNTER — Emergency Department (HOSPITAL_COMMUNITY): Payer: No Typology Code available for payment source

## 2022-10-23 ENCOUNTER — Encounter (HOSPITAL_COMMUNITY): Payer: Self-pay

## 2022-10-23 ENCOUNTER — Emergency Department (HOSPITAL_COMMUNITY)
Admission: EM | Admit: 2022-10-23 | Discharge: 2022-10-23 | Disposition: A | Discharge status: Home / Self Care | Payer: No Typology Code available for payment source | Attending: Emergency Medicine | Admitting: Emergency Medicine

## 2022-10-23 DIAGNOSIS — M25562 Pain in left knee: Secondary | ICD-10-CM

## 2022-10-23 MED ORDER — HYDROMORPHONE HCL 1 MG/ML IJ SOLN
1.0000 mg | Freq: Once | INTRAMUSCULAR | Status: AC
  Administered 2022-10-23: 1 mg via INTRAMUSCULAR
  Filled 2022-10-23: qty 1

## 2022-10-23 MED ORDER — PREDNISONE 10 MG PO TABS: 20.0000 mg | ORAL_TABLET | Freq: Every day | ORAL | 0 refills | Status: DC

## 2022-10-23 MED ORDER — OXYCODONE-ACETAMINOPHEN 5-325 MG PO TABS: 1.0000 | ORAL_TABLET | Freq: Four times a day (QID) | ORAL | 0 refills | Status: DC | PRN

## 2022-10-23 MED ORDER — METHYLPREDNISOLONE SODIUM SUCC 125 MG IJ SOLR
125.0000 mg | Freq: Once | INTRAMUSCULAR | Status: AC
  Administered 2022-10-23: 125 mg via INTRAMUSCULAR
  Filled 2022-10-23: qty 2

## 2022-10-23 NOTE — ED Triage Notes (Signed)
Pt arrived via POV, c/o right arm/hand pain and left knee pain. No trauma to area.

## 2022-10-23 NOTE — Discharge Instructions (Addendum)
Follow-up with your orthopedic doctor.  Dr. Percell Miller.,     Rest your leg is much as possible

## 2022-10-23 NOTE — ED Provider Notes (Signed)
[facility] DEPT Provider Note   CSN: [id number] Arrival date & time: 10/23/22  1407     History {Add pertinent medical, surgical, social history, OB history to HPI:1} Chief Complaint  Patient presents with   Arm Pain   Knee Pain    Ernest Walker is a 76 y.o. male.  Patient has a history of hypertension diabetes.  He is complaining of right wrist pain and left knee pain   Arm Pain  Knee Pain      Home Medications Prior to Admission medications   Medication Sig Start Date End Date Taking? Authorizing Provider  oxyCODONE-acetaminophen (PERCOCET) 5-325 MG tablet Take 1 tablet by mouth every 6 (six) hours as needed. 10/23/22  Yes Milton Ferguson, MD  predniSONE (DELTASONE) 10 MG tablet Take 2 tablets (20 mg total) by mouth daily. 10/23/22  Yes Milton Ferguson, MD  amLODipine (NORVASC) 10 MG tablet TAKE ONE-HALF TABLET BY MOUTH EVERY DAY FOR BLOOD PRESSURE 04/12/22   [provider]  amoxicillin-clavulanate (AUGMENTIN) 250-125 MG tablet Take 1 tablet by mouth 2 (two) times daily. 10/20/22   Raspet, Derry Skill, PA-C  aspirin 81 MG chewable tablet CHEW ONE TABLET BY MOUTH EVERY DAY ASK YOUR DOCTOR HOW LONG YOU SHOULD TAKE THIS MEDICATION 04/12/22   [provider]  atorvastatin (LIPITOR) 20 MG tablet Take 20 mg by mouth daily.    [provider]  atorvastatin (LIPITOR) 80 MG tablet TAKE ONE-HALF TABLET BY MOUTH AT BEDTIME FOR CHOLESTEROL 10/12/21   [provider]  calcitRIOL (ROCALTROL) 0.25 MCG capsule Take 1 capsule by mouth 2 (two) times daily. 04/12/22   [provider]  empagliflozin (JARDIANCE) 25 MG TABS tablet Take 0.5 tablets by mouth daily. 09/29/20   [provider]  ferrous sulfate 325 (65 FE) MG tablet Take 325 mg by mouth See admin instructions. Take one tablet on Monday, Wednesday, and Friday 11/09/20   [provider]  insulin glargine-yfgn (SEMGLEE) 100 UNIT/ML Pen INJECT 38 UNITS  UNDER SKIN EVERY MORNING BEFORE BREAKFAST FOR DIABETES.DISCARD PEN 28 DAYS AFTER OPENING**REPLACES LANTUS** 10/07/21   [provider]  lisinopril (ZESTRIL) 40 MG tablet TAKE ONE-HALF TABLET BY MOUTH EVERY DAY FOR BLOOD PRESSURE 04/12/22   [provider]  metoprolol (LOPRESSOR) 50 MG tablet Take 50 mg by mouth 2 (two) times daily.    [provider]  NON FORMULARY [facility]  ANTIFUNGAL (NAIL)-#1    [provider]  ondansetron (ZOFRAN) 8 MG tablet TAKE ONE-HALF TABLET BY MOUTH EVERY 8 HOURS AS NEEDED FOR NAUSEA 08/30/21   [provider]  pantoprazole (PROTONIX) 40 MG tablet TAKE ONE TABLET BY MOUTH ONCE DAILY BEFORE A MEAL TO CONTROL STOMACH ACID 04/01/22   [provider]  terazosin (HYTRIN) 10 MG capsule Take 1 capsule by mouth at bedtime. 04/12/22   [provider]      Allergies    Cholecalciferol, Glucophage [metformin], Drug class [niacin and related], and Niacin    Review of Systems   Review of Systems  Physical Exam Updated Vital Signs BP (!) 137/57 (BP Location: Left Arm)   Pulse 82   Temp 98.6 F (37 C) (Oral)   Resp 16   SpO2 100%  Physical Exam  ED Results / Procedures / Treatments   Labs (all labs ordered are listed, but only abnormal results are displayed) Labs Reviewed - No data to display  EKG None  Radiology DG Knee Complete 4 Views Left  Result Date: 10/23/2022 CLINICAL  DATA:  LEFT knee pain for 1 week. No known injury. Initial encounter. EXAM: LEFT KNEE - COMPLETE 4+ VIEW COMPARISON:  None Available. FINDINGS: There is no evidence of acute fracture, subluxation or dislocation. No joint effusion is noted. Mild joint space narrowing in the patellofemoral compartment identified. Heavy vascular calcifications are present. No focal bony lesions are present. IMPRESSION: 1. No acute abnormality. 2. Mild patellofemoral compartment degenerative changes. Electronically Signed   By: Margarette Canada M.D.    On: 10/23/2022 16:20    Procedures Procedures  {Document cardiac monitor, telemetry assessment procedure when appropriate:1}  Medications Ordered in ED Medications  HYDROmorphone (DILAUDID) injection 1 mg (1 mg Intramuscular Given 10/23/22 1544)  methylPREDNISolone sodium succinate (SOLU-MEDROL) 125 mg/2 mL injection 125 mg (125 mg Intramuscular Given 10/23/22 1545)    ED Course/ Medical Decision Making/ A&P                           Medical Decision Making Amount and/or Complexity of Data Reviewed Radiology: ordered.  Risk Prescription drug management.  Patient with joint inflammation and arthritis to right wrist and left knee.  He is started on prednisone and given pain medicine and will follow-up with his orthopedic doctor {Document critical care time when appropriate:1} {Document review of labs and clinical decision tools ie heart score, Chads2Vasc2 etc:1}  {Document your independent review of radiology images, and any outside records:1} {Document your discussion with family members, caretakers, and with consultants:1} {Document social determinants of health affecting pt's care:1} {Document your decision making why or why not admission, treatments were needed:1} Final Clinical Impression(s) / ED Diagnoses Final diagnoses:  None    Rx / DC Orders ED Discharge Orders          Ordered    predniSONE (DELTASONE) 10 MG tablet  Daily        10/23/22 1732    oxyCODONE-acetaminophen (PERCOCET) 5-325 MG tablet  Every 6 hours PRN        10/23/22 1732

## 2022-10-24 ENCOUNTER — Telehealth (HOSPITAL_COMMUNITY): Payer: Self-pay | Admitting: Emergency Medicine

## 2022-10-24 MED ORDER — HYDROCODONE-ACETAMINOPHEN 5-325 MG PO TABS: 1.0000 | ORAL_TABLET | Freq: Four times a day (QID) | ORAL | 0 refills | Status: DC | PRN

## 2022-10-24 NOTE — Telephone Encounter (Signed)
Pharmacy selected did not have percocet in stock - script changed to [facility].

## 2022-10-24 NOTE — ED Notes (Signed)
Dr Francia Greaves is aware [facility] is out of Oxycodone. Writer called to confirm with pharmacy and see what is available to substitute with. They do have Hydrocodone-Acetaminophen. Dr Francia Greaves is sending a script to the pharmacy. Mrs Olazabal is aware the medication will be available to pick up shortly.

## 2022-11-04 ENCOUNTER — Ambulatory Visit (INDEPENDENT_AMBULATORY_CARE_PROVIDER_SITE_OTHER): Payer: No Typology Code available for payment source | Admitting: Podiatry

## 2022-11-04 VITALS — BP 153/64

## 2022-11-04 DIAGNOSIS — M79675 Pain in left toe(s): Secondary | ICD-10-CM

## 2022-11-04 DIAGNOSIS — E1149 Type 2 diabetes mellitus with other diabetic neurological complication: Secondary | ICD-10-CM | POA: Diagnosis not present

## 2022-11-04 DIAGNOSIS — E08621 Diabetes mellitus due to underlying condition with foot ulcer: Secondary | ICD-10-CM | POA: Diagnosis not present

## 2022-11-04 DIAGNOSIS — L97411 Non-pressure chronic ulcer of right heel and midfoot limited to breakdown of skin: Secondary | ICD-10-CM | POA: Diagnosis not present

## 2022-11-04 DIAGNOSIS — L84 Corns and callosities: Secondary | ICD-10-CM

## 2022-11-04 DIAGNOSIS — M79674 Pain in right toe(s): Secondary | ICD-10-CM

## 2022-11-04 DIAGNOSIS — B351 Tinea unguium: Secondary | ICD-10-CM

## 2022-11-04 NOTE — Progress Notes (Unsigned)
  Subjective:  Patient ID: Ernest Walker, male    DOB: 1946/10/01,  MRN: [id number]  Ernest Walker presents to clinic today for {jgcomplaint:23593} No chief complaint on file.  New problem(s): None. {jgcomplaint:23593}  PCP is [facility].  Allergies  Allergen Reactions   Cholecalciferol     Other reaction(s): Constipation   Glucophage [Metformin] Diarrhea   Drug Class [Niacin And Related] Rash   Niacin     Other reaction(s): Flushing    Review of Systems: Negative except as noted in the HPI.  Objective: No changes noted in today's physical examination. There were no vitals filed for this visit. Ernest Walker is a pleasant 77 y.o. male {jgbodyhabitus:24098} AAO x 3. Vascular Examination: CFT <3 seconds b/l LE. Palpable DP/PT pulses b/l LE. Digital hair absent b/l. Skin temperature gradient WNL b/l. No pain with calf compression b/l. No edema noted b/l. No cyanosis or clubbing noted b/l LE.  Dermatological Examination: Pedal integument with normal turgor, texture and tone b/l LE. No open wounds b/l. No interdigital macerations b/l. Toenails 1-5 b/l elongated, thickened, discolored with subungual debris. +Tenderness with dorsal palpation of nailplates.   Preulcerative lesion noted submet head 1 right foot. There is visible subdermal hemorrhage. There is no surrounding erythema, no edema, no drainage, no odor, no fluctuance. Postdebridement, reveals no open wound on today's visit.  Neurological Examination: Protective sensation decreased with 10 gram monofilament b/l. Vibratory sensation intact b/l. Proprioception intact bilaterally.  Musculoskeletal Examination: Normal muscle strength 5/5 to all lower extremity muscle groups bilaterally. HAV with bunion deformity noted b/l LE. Hammertoe deformity noted 2-5 b/l. Pes planus deformity noted bilateral LE. No pain, crepitus or joint limitation noted with ROM b/l LE.  Patient ambulates independently without assistive  aids.  Assessment/Plan: 1. Pain due to onychomycosis of toenails of both feet   2. Pre-ulcerative calluses   3. Type II diabetes mellitus with neurological manifestations ([place])     No orders of the defined types were placed in this encounter.   None {Jgplan:23602::"-Patient/POA to call should there be question/concern in the interim."}   No follow-ups on file.  Marzetta Board, DPM

## 2022-11-07 ENCOUNTER — Encounter: Payer: Self-pay | Admitting: Podiatry

## 2022-11-15 ENCOUNTER — Ambulatory Visit (INDEPENDENT_AMBULATORY_CARE_PROVIDER_SITE_OTHER): Payer: Medicare Other

## 2022-11-15 ENCOUNTER — Ambulatory Visit (INDEPENDENT_AMBULATORY_CARE_PROVIDER_SITE_OTHER): Payer: Medicare Other | Admitting: Podiatry

## 2022-11-15 DIAGNOSIS — E11621 Type 2 diabetes mellitus with foot ulcer: Secondary | ICD-10-CM

## 2022-11-15 DIAGNOSIS — L97412 Non-pressure chronic ulcer of right heel and midfoot with fat layer exposed: Secondary | ICD-10-CM | POA: Diagnosis not present

## 2022-11-15 DIAGNOSIS — M899 Disorder of bone, unspecified: Secondary | ICD-10-CM | POA: Diagnosis not present

## 2022-11-15 NOTE — Progress Notes (Signed)
  Subjective:  Patient ID: Ernest Walker, male    DOB: 1946/09/05,  MRN: [id number]  Chief Complaint  Patient presents with   Bunions    Pt states he wants to know if Dr Sherryle Lis could do surgery on bunion of right foot     77 y.o. male presents with the above complaint. History confirmed with patient.  He is doing okay lesion has not fully gone away.  He still has some discomfort here as well.  Says his diabetes is well-controlled.  He is interested in surgical options to treat this now  Objective:  Physical Exam: warm, good capillary refill, no trophic changes or ulcerative lesions, normal DP and PT pulses, abnormal sensory exam, and small submetatarsal 1 right foot ulcer remains healed with no open skin there is overlying hyperkeratosis and preulcerative callus, tenderness here on palpation.  No signs of infection or drainage   Right foot radiographs taken today show prominence of tibial sesamoid directly deep to the preulcerative lesion  Assessment:   1. Diabetic ulcer of right heel associated with type 2 diabetes mellitus, with fat layer exposed ([place])   2. Sesamoid pain       Plan:  Patient was evaluated and treated and all questions answered.  We reviewed his radiographs today.  We again discussed further nonsurgical and surgical treatment.  I do think surgery would benefit him from a reduction of the prominence of the tibial sesamoid through minimally invasive approach.  We discussed the risk benefits and potential complications of such a procedure including but limited to  pain, swelling, infection, scar, numbness which may be temporary or permanent, chronic pain, stiffness, nerve pain or damage, wound healing problems, bone healing problems including delayed or non-union.  We also discussed the unique possible complications of a sesamoid fracture which could lead to need for sesamoidectomy and worsening hallux valgus deformity.  I do think he is a good surgical candidate for  this.  We discussed the postoperative recovery process including the period of nonweightbearing in a surgical shoe with an offloading peg assist insert.  Surgery will be scheduled at a mutually agreeable date.  All questions were addressed.  Informed consent was signed and reviewed   Surgical plan:  Procedure: -Right foot partial tibial sesamoidectomy with MIS approach  Location: -[facility]  Anesthesia plan: -IV sedation with local  Postoperative pain plan: - Tylenol 1000 mg every 6 hours, oxycodone 5 mg 1-2 tabs every 6 hours only as needed  DVT prophylaxis: -None required  WB Restrictions / DME needs: -WBAT in surgical shoe which he has.  I will bring a peg assist shoe to the operating room to offload the surgical site    No follow-ups on file.

## 2022-12-09 ENCOUNTER — Other Ambulatory Visit: Payer: Self-pay | Admitting: Podiatry

## 2022-12-09 DIAGNOSIS — E1140 Type 2 diabetes mellitus with diabetic neuropathy, unspecified: Secondary | ICD-10-CM

## 2022-12-09 DIAGNOSIS — M7741 Metatarsalgia, right foot: Secondary | ICD-10-CM

## 2022-12-09 DIAGNOSIS — L84 Corns and callosities: Secondary | ICD-10-CM

## 2022-12-09 MED ORDER — ACETAMINOPHEN 500 MG PO TABS: 1000.0000 mg | ORAL_TABLET | Freq: Four times a day (QID) | ORAL | 0 refills | Status: AC | PRN

## 2022-12-09 MED ORDER — OXYCODONE HCL 5 MG PO TABS: 5.0000 mg | ORAL_TABLET | ORAL | 0 refills | Status: AC | PRN

## 2022-12-15 ENCOUNTER — Ambulatory Visit (INDEPENDENT_AMBULATORY_CARE_PROVIDER_SITE_OTHER): Payer: No Typology Code available for payment source

## 2022-12-15 ENCOUNTER — Ambulatory Visit (INDEPENDENT_AMBULATORY_CARE_PROVIDER_SITE_OTHER): Payer: No Typology Code available for payment source | Admitting: Podiatry

## 2022-12-15 DIAGNOSIS — M899 Disorder of bone, unspecified: Secondary | ICD-10-CM | POA: Diagnosis not present

## 2022-12-15 DIAGNOSIS — M2012 Hallux valgus (acquired), left foot: Secondary | ICD-10-CM

## 2022-12-15 DIAGNOSIS — M2011 Hallux valgus (acquired), right foot: Secondary | ICD-10-CM | POA: Diagnosis not present

## 2022-12-18 ENCOUNTER — Encounter: Payer: Self-pay | Admitting: Podiatry

## 2022-12-18 NOTE — Progress Notes (Signed)
  Subjective:  Patient ID: Ernest Walker, male    DOB: 20-Jan-1946,  MRN: [id number]  Chief Complaint  Patient presents with   Routine Post Op    POV #1 DOS 12/09/22 --- SHAVING OF BONE UNDER RIGHT GREAT TOE     77 y.o. male returns for post-op check.  He is doing well not having much pain  Review of Systems: Negative except as noted in the HPI. Denies N/V/F/Ch.   Objective:  There were no vitals filed for this visit. There is no height or weight on file to calculate BMI. Constitutional Well developed. Well nourished.  Vascular Foot warm and well perfused. Capillary refill normal to all digits.  Calf is soft and supple, no posterior calf or knee pain, negative Homans' sign  Neurologic Normal speech. Oriented to person, place, and time. Epicritic sensation to light touch grossly present bilaterally.  Dermatologic Skin healing well without signs of infection. Skin edges well coapted without signs of infection.  Orthopedic: Tenderness to palpation noted about the surgical site.   Multiple view plain film radiographs: Good reduction of sesamoid prominence of tibial sesamoid Assessment:   1. Sesamoid pain    Plan:  Patient was evaluated and treated and all questions answered.  S/p foot surgery right -Progressing as expected post-operatively. -XR: Noted above no complications good reduction of bony prominence -WB Status: WBAT in surgical shoe -Sutures: Return to weeks for follow-up.  He may return to regular shoe gear and activity as tolerated gradually after this -Medications: No refills required -Foot redressed.  He may begin showering at home regular bathing  Return in about 2 weeks (around 12/29/2022) for suture removal, post op (no x-rays).

## 2022-12-27 ENCOUNTER — Ambulatory Visit: Payer: No Typology Code available for payment source

## 2022-12-29 ENCOUNTER — Ambulatory Visit: Payer: No Typology Code available for payment source

## 2022-12-29 ENCOUNTER — Ambulatory Visit: Payer: No Typology Code available for payment source | Admitting: Podiatry

## 2022-12-29 DIAGNOSIS — M899 Disorder of bone, unspecified: Secondary | ICD-10-CM

## 2022-12-29 DIAGNOSIS — T8141XA Infection following a procedure, superficial incisional surgical site, initial encounter: Secondary | ICD-10-CM

## 2022-12-29 MED ORDER — CIPROFLOXACIN HCL 500 MG PO TABS: 500.0000 mg | ORAL_TABLET | Freq: Two times a day (BID) | ORAL | 0 refills | Status: AC

## 2023-01-02 NOTE — Progress Notes (Signed)
  Subjective:  Patient ID: Ernest Walker, male    DOB: July 20, 1946,  MRN: [id number]  No chief complaint on file.    77 y.o. male returns for post-op check.  He notes increased swelling since last visit, his wife is concerned he may have infection  Review of Systems: Negative except as noted in the HPI. Denies N/V/F/Ch.   Objective:  There were no vitals filed for this visit. There is no height or weight on file to calculate BMI. Constitutional Well developed. Well nourished.  Vascular Foot warm and well perfused. Capillary refill normal to all digits.  Calf is soft and supple, no posterior calf or knee pain, negative Homans' sign  Neurologic Normal speech. Oriented to person, place, and time. Epicritic sensation to light touch grossly present bilaterally.  Dermatologic Incision is well-healed there is some edema, erythema and a bulla around the incision that is draining serous fluid  Orthopedic: Tenderness to palpation noted about the surgical site.   Multiple view plain film radiographs: Good reduction of sesamoid prominence of tibial sesamoid Assessment:   1. Superficial incisional surgical site infection   2. Sesamoid pain    Plan:  Patient was evaluated and treated and all questions answered.  S/p foot surgery right -Sutures removed there was some drainage around the area with a superficial surgical site infection.  Culture of this drainage was taken.  I placed him on ciprofloxacin.  He may gradually return to regular shoe gear.  Advised to apply Betadine to incision where blister was.  I will see him back in 1 week for reevaluation.  Return in about 1 week (around 01/05/2023) for post op (no x-rays).

## 2023-01-04 LAB — ANAEROBIC AND AEROBIC CULTURE
MICRO NUMBER:: 14662761
MICRO NUMBER:: 14662762
SPECIMEN QUALITY:: ADEQUATE
SPECIMEN QUALITY:: ADEQUATE

## 2023-01-05 ENCOUNTER — Ambulatory Visit (INDEPENDENT_AMBULATORY_CARE_PROVIDER_SITE_OTHER): Payer: No Typology Code available for payment source | Admitting: Podiatry

## 2023-01-05 ENCOUNTER — Ambulatory Visit: Payer: No Typology Code available for payment source

## 2023-01-05 DIAGNOSIS — L97412 Non-pressure chronic ulcer of right heel and midfoot with fat layer exposed: Secondary | ICD-10-CM

## 2023-01-05 DIAGNOSIS — M899 Disorder of bone, unspecified: Secondary | ICD-10-CM

## 2023-01-05 DIAGNOSIS — M86171 Other acute osteomyelitis, right ankle and foot: Secondary | ICD-10-CM

## 2023-01-05 DIAGNOSIS — E08621 Diabetes mellitus due to underlying condition with foot ulcer: Secondary | ICD-10-CM

## 2023-01-06 ENCOUNTER — Ambulatory Visit: Payer: No Typology Code available for payment source | Admitting: Podiatry

## 2023-01-07 LAB — CBC WITH DIFFERENTIAL/PLATELET
Basophils Absolute: 0.1 10*3/uL (ref 0.0–0.2)
Basos: 1 %
EOS (ABSOLUTE): 0.3 10*3/uL (ref 0.0–0.4)
Eos: 4 %
Hematocrit: 28.8 % — ABNORMAL LOW (ref 37.5–51.0)
Hemoglobin: 8.4 g/dL — ABNORMAL LOW (ref 13.0–17.7)
Immature Grans (Abs): 0.1 10*3/uL (ref 0.0–0.1)
Immature Granulocytes: 1 %
Lymphocytes Absolute: 2.4 10*3/uL (ref 0.7–3.1)
Lymphs: 28 %
MCH: 23.9 pg — ABNORMAL LOW (ref 26.6–33.0)
MCHC: 29.2 g/dL — ABNORMAL LOW (ref 31.5–35.7)
MCV: 82 fL (ref 79–97)
Monocytes Absolute: 0.7 10*3/uL (ref 0.1–0.9)
Monocytes: 8 %
Neutrophils Absolute: 5 10*3/uL (ref 1.4–7.0)
Neutrophils: 58 %
Platelets: 350 10*3/uL (ref 150–450)
RBC: 3.52 x10E6/uL — ABNORMAL LOW (ref 4.14–5.80)
RDW: 14.6 % (ref 11.6–15.4)
WBC: 8.5 10*3/uL (ref 3.4–10.8)

## 2023-01-07 LAB — SEDIMENTATION RATE: Sed Rate: 111 mm/hr — ABNORMAL HIGH (ref 0–30)

## 2023-01-07 LAB — C-REACTIVE PROTEIN: CRP: 17 mg/L — ABNORMAL HIGH (ref 0–10)

## 2023-01-09 ENCOUNTER — Telehealth: Payer: Self-pay | Admitting: Podiatry

## 2023-01-09 MED ORDER — DOXYCYCLINE HYCLATE 100 MG PO TABS: 100.0000 mg | ORAL_TABLET | Freq: Two times a day (BID) | ORAL | 0 refills | Status: DC

## 2023-01-09 NOTE — Progress Notes (Signed)
  Subjective:  Patient ID: Ernest Walker, male    DOB: 1946/05/11,  MRN: [id number]  Chief Complaint  Patient presents with   Routine Post Op    POV #3 DOS 12/09/22 --- SHAVING OF BONE UNDER RIGHT GREAT TOE     77 y.o. male returns for post-op check.  He he has been taking the antibiotics he says he notes some improvement in pain there has not been any drainage  Review of Systems: Negative except as noted in the HPI. Denies N/V/F/Ch.   Objective:  There were no vitals filed for this visit. There is no height or weight on file to calculate BMI. Constitutional Well developed. Well nourished.  Vascular Foot warm and well perfused. Capillary refill normal to all digits.  Calf is soft and supple, no posterior calf or knee pain, negative Homans' sign  Neurologic Normal speech. Oriented to person, place, and time. Epicritic sensation to light touch grossly present bilaterally.  Dermatologic Edema is improving there is no active drainage today  Orthopedic: Tenderness to palpation noted about the surgical site.    Multiple view plain film radiographs: New films taken today show possible increased osteolysis around the first metatarsal head and proximal phalanx base Assessment:   1. Diabetic ulcer of right midfoot associated with diabetes mellitus due to underlying condition, with fat layer exposed ([place])   2. Sesamoid pain    Plan:  Patient was evaluated and treated and all questions answered.  S/p foot surgery right -Clinical he does show some improvement.  He did have several areas on his x-ray including first metatarsal head that showed decreased bone density and concerned that there could be osteolysis and development of osteomyelitis here.  I recommended checking with lab work and his ESR was quite elevated at 111.  Given these findings I recommend evaluating with an MRI and a stat MRI has been ordered.  Referral to infectious disease also placed.  Return in about 1 week (around  01/12/2023) for post op (no x-rays).

## 2023-01-09 NOTE — Telephone Encounter (Signed)
Called pt to review lab results, left VM. With elevated ESR and decreased density on XR from last visit I would recommend MRI, order placed for [facility] [address] lcoation, referral to infectious diseases placed, and new Rx for doxycycline 100mg  BID sent.   Lanae Crumbly, DPM 01/09/2023

## 2023-01-12 ENCOUNTER — Encounter: Payer: No Typology Code available for payment source | Admitting: Podiatry

## 2023-01-12 ENCOUNTER — Ambulatory Visit (INDEPENDENT_AMBULATORY_CARE_PROVIDER_SITE_OTHER): Payer: No Typology Code available for payment source | Admitting: Podiatry

## 2023-01-12 ENCOUNTER — Other Ambulatory Visit: Payer: Self-pay

## 2023-01-12 DIAGNOSIS — M86171 Other acute osteomyelitis, right ankle and foot: Secondary | ICD-10-CM

## 2023-01-12 DIAGNOSIS — E08621 Diabetes mellitus due to underlying condition with foot ulcer: Secondary | ICD-10-CM

## 2023-01-15 ENCOUNTER — Ambulatory Visit
Admission: RE | Admit: 2023-01-15 | Discharge: 2023-01-15 | Discharge status: Home / Self Care | Payer: No Typology Code available for payment source | Source: Ambulatory Visit | Attending: Podiatry

## 2023-01-15 DIAGNOSIS — M899 Disorder of bone, unspecified: Secondary | ICD-10-CM

## 2023-01-15 DIAGNOSIS — L97412 Non-pressure chronic ulcer of right heel and midfoot with fat layer exposed: Secondary | ICD-10-CM

## 2023-01-15 NOTE — Progress Notes (Signed)
  Subjective:  Patient ID: Ernest Walker, male    DOB: 1946/04/01,  MRN: [id number]  Chief Complaint  Patient presents with   Routine Post Op    Patient states that he has had some drainage. 0/10 pain. No f/c/n/v     77 y.o. male returns for post-op check.  He is still taking the antibiotics.  Says the drainage has slowed down.  Has not had the MRI completed yet.  Review of Systems: Negative except as noted in the HPI. Denies N/V/F/Ch.   Objective:  There were no vitals filed for this visit. There is no height or weight on file to calculate BMI. Constitutional Well developed. Well nourished.  Vascular Foot warm and well perfused. Capillary refill normal to all digits.  Calf is soft and supple, no posterior calf or knee pain, negative Homans' sign  Neurologic Normal speech. Oriented to person, place, and time. Epicritic sensation to light touch grossly present bilaterally.  Dermatologic Edema is improving there is no active drainage today  Orthopedic: Tenderness to palpation noted about the surgical site.    Multiple view plain film radiographs: New films taken today show possible increased osteolysis around the first metatarsal head and proximal phalanx base Assessment:   1. Other acute osteomyelitis of right foot ([place])    Plan:  Patient was evaluated and treated and all questions answered.  S/p foot surgery right -So far seems to be controlled with oral antibiotics.  MRI is scheduled for this weekend.  I will let her know the results of this and discuss further treatment options.  Discussed ultimately may require surgical I&D pending results if there is deeper infection.  I will see him back in 2 weeks.  Return in about 12 days (around 01/24/2023) for post op (new x-rays).

## 2023-01-16 ENCOUNTER — Telehealth: Payer: Self-pay | Admitting: Podiatry

## 2023-01-16 DIAGNOSIS — M86171 Other acute osteomyelitis, right ankle and foot: Secondary | ICD-10-CM

## 2023-01-16 DIAGNOSIS — M00071 Staphylococcal arthritis, right ankle and foot: Secondary | ICD-10-CM

## 2023-01-16 NOTE — Telephone Encounter (Signed)
So they denied the referral you put in today because one was put in on 01/09/23. They called the patient to schedule but he has to get it authorized by the [place] first. They also said that the patient wasn't aware that the referral was put in on that day so he was going to call them back to schedule after he spoke with you about it.

## 2023-01-16 NOTE — Telephone Encounter (Signed)
Called patient to review MRI results with concern for septic arthritis and bone infection, I have recommended surgical debridement and I&D of the great toe joint which he understands. He will be out of town this week, will keep him on the doxycycline for now. Referral placed for ID consultation. Will schedule I&D when he returns. He understand if the infection worsens acutely to proceed to nearest ER for admission if he develops worsening drainage, swelling, erythema, fevers, chills, nausea, vomiting.   Lanae Crumbly, DPM 01/16/2023

## 2023-01-19 ENCOUNTER — Encounter: Payer: No Typology Code available for payment source | Admitting: Podiatry

## 2023-01-24 ENCOUNTER — Other Ambulatory Visit: Payer: Self-pay | Admitting: Podiatry

## 2023-01-24 ENCOUNTER — Encounter: Payer: No Typology Code available for payment source | Admitting: Podiatry

## 2023-01-24 DIAGNOSIS — M86171 Other acute osteomyelitis, right ankle and foot: Secondary | ICD-10-CM

## 2023-01-24 MED ORDER — DOXYCYCLINE HYCLATE 100 MG PO TABS: 100.0000 mg | ORAL_TABLET | Freq: Two times a day (BID) | ORAL | 0 refills | Status: DC

## 2023-01-24 MED ORDER — TRAMADOL HCL 50 MG PO TABS: 50.0000 mg | ORAL_TABLET | Freq: Four times a day (QID) | ORAL | 0 refills | Status: AC | PRN

## 2023-01-31 ENCOUNTER — Ambulatory Visit (INDEPENDENT_AMBULATORY_CARE_PROVIDER_SITE_OTHER): Payer: No Typology Code available for payment source

## 2023-01-31 ENCOUNTER — Ambulatory Visit (INDEPENDENT_AMBULATORY_CARE_PROVIDER_SITE_OTHER): Payer: No Typology Code available for payment source | Admitting: Podiatry

## 2023-01-31 VITALS — BP 115/53 | HR 83 | Temp 98.0°F

## 2023-01-31 DIAGNOSIS — M00071 Staphylococcal arthritis, right ankle and foot: Secondary | ICD-10-CM | POA: Diagnosis not present

## 2023-01-31 DIAGNOSIS — E08621 Diabetes mellitus due to underlying condition with foot ulcer: Secondary | ICD-10-CM

## 2023-01-31 DIAGNOSIS — L97412 Non-pressure chronic ulcer of right heel and midfoot with fat layer exposed: Secondary | ICD-10-CM

## 2023-01-31 DIAGNOSIS — M86171 Other acute osteomyelitis, right ankle and foot: Secondary | ICD-10-CM | POA: Diagnosis not present

## 2023-02-01 NOTE — Progress Notes (Signed)
  Subjective:  Patient ID: Ernest Walker, male    DOB: June 11, 1946,  MRN: [id number]  Chief Complaint  Patient presents with   Routine Post Op    POV # 1 DOS 01/24/23 --- INCISION AND DRAINAGE OF RIGHT FOOT INFECTION WITH BIOPSY AND CULTURE     77 y.o. male returns for post-op check.  He says the foot is feeling better he is not having any pain.  Review of Systems: Negative except as noted in the HPI. Denies N/V/F/Ch.   Objective:   Vitals:   01/31/23 0826  BP: (!) 115/53  Pulse: 83  Temp: 98 F (36.7 C)   There is no height or weight on file to calculate BMI. Constitutional Well developed. Well nourished.  Vascular Foot warm and well perfused. Capillary refill normal to all digits.  Calf is soft and supple, no posterior calf or knee pain, negative Homans' sign  Neurologic Normal speech. Oriented to person, place, and time. Epicritic sensation to light touch grossly present bilaterally.  Dermatologic Skin healing well without signs of infection. Skin edges well coapted without signs of infection.  Orthopedic: Tenderness to palpation noted about the surgical site.   Multiple view plain film radiographs: Interval debridement with application of antibiotic beads, worsening hallux valgus deformity  Pathology results are still pending  Joint fluid culture taken 01/24/2023 shows no growth Bone culture taken 01/24/2023 shows no growth  Assessment:   1. Other acute osteomyelitis of right foot   2. Staphylococcal arthritis of right foot   3. Diabetic ulcer of right midfoot associated with diabetes mellitus due to underlying condition, with fat layer exposed    Plan:  Patient was evaluated and treated and all questions answered.  S/p foot surgery right -Progressing as expected post-operatively. -Infectious disease referral placed for management of his likely osteomyelitis, would like to avoid amputation and treat successfully.  So far no growth in culture data from the OR,  presumably staph lugdunensis from fluid culture from wound prior to surgery -XR: Noted above -WB Status: WBAT in postop shoe -Sutures: We will remove in 2 weeks. -Foot redressed.  Continue to change daily with Betadine.  He may begin to shower now -We discussed his worsening hallux valgus deformity, we will have to address this later once his infection is controlled and he has been able to heal.  No follow-ups on file.

## 2023-02-14 ENCOUNTER — Ambulatory Visit (INDEPENDENT_AMBULATORY_CARE_PROVIDER_SITE_OTHER): Payer: No Typology Code available for payment source | Admitting: Podiatry

## 2023-02-14 ENCOUNTER — Encounter: Payer: Self-pay | Admitting: Podiatry

## 2023-02-14 DIAGNOSIS — T8141XA Infection following a procedure, superficial incisional surgical site, initial encounter: Secondary | ICD-10-CM

## 2023-02-14 NOTE — Progress Notes (Signed)
Subjective:  Patient ID: Ernest Walker, male    DOB: March 26, 1946,  MRN: [id number]  Chief Complaint  Patient presents with   Routine Post Op    POV # 2 DOS 01/24/23 --- INCISION AND DRAINAGE OF RIGHT FOOT INFECTION WITH BIOPSY AND CULTURE   "Its doing fine, but my toe is now turned to the side"    DOS: 01/24/23 Procedure: Right foot Incision and drainage with biopsy and culture   77 y.o. male returns for POV#2. Relates doing well and has been dressing as instructed. Ran out of anitbiotcs on Sunday but doing well. . Has appointment scheduled tomorrow with ID.   Review of Systems: Negative except as noted in the HPI. Denies N/V/F/Ch.  Past Medical History:  Diagnosis Date   Arthritis    hands, knees   Carpal tunnel syndrome of right wrist    Diabetes mellitus without complication    Hypertension    Prostate cancer 2007   radiation implant done at [facility] in [facility]    Current Outpatient Medications:    amLODipine (NORVASC) 10 MG tablet, TAKE ONE-HALF TABLET BY MOUTH EVERY DAY FOR BLOOD PRESSURE, Disp: , Rfl:    aspirin 81 MG chewable tablet, CHEW ONE TABLET BY MOUTH EVERY DAY ASK YOUR DOCTOR HOW LONG YOU SHOULD TAKE THIS MEDICATION, Disp: , Rfl:    atorvastatin (LIPITOR) 20 MG tablet, Take 20 mg by mouth daily., Disp: , Rfl:    atorvastatin (LIPITOR) 80 MG tablet, TAKE ONE-HALF TABLET BY MOUTH AT BEDTIME FOR CHOLESTEROL, Disp: , Rfl:    calcitRIOL (ROCALTROL) 0.25 MCG capsule, Take 1 capsule by mouth 2 (two) times daily., Disp: , Rfl:    doxycycline (VIBRA-TABS) 100 MG tablet, Take 1 tablet (100 mg total) by mouth 2 (two) times daily., Disp: 28 tablet, Rfl: 0   empagliflozin (JARDIANCE) 25 MG TABS tablet, Take 0.5 tablets by mouth daily., Disp: , Rfl:    ferrous sulfate 325 (65 FE) MG tablet, Take 325 mg by mouth See admin instructions. Take one tablet on Monday, Wednesday, and Friday, Disp: , Rfl:    insulin glargine-yfgn (SEMGLEE) 100 UNIT/ML Pen, INJECT 38 UNITS UNDER SKIN EVERY MORNING  BEFORE BREAKFAST FOR DIABETES.DISCARD PEN 28 DAYS AFTER OPENING**REPLACES LANTUS**, Disp: , Rfl:    lisinopril (ZESTRIL) 40 MG tablet, TAKE ONE-HALF TABLET BY MOUTH EVERY DAY FOR BLOOD PRESSURE, Disp: , Rfl:    metoprolol (LOPRESSOR) 50 MG tablet, Take 50 mg by mouth 2 (two) times daily., Disp: , Rfl:    NON FORMULARY, [facility]  ANTIFUNGAL (NAIL)-#1, Disp: , Rfl:    ondansetron (ZOFRAN) 8 MG tablet, TAKE ONE-HALF TABLET BY MOUTH EVERY 8 HOURS AS NEEDED FOR NAUSEA, Disp: , Rfl:    pantoprazole (PROTONIX) 40 MG tablet, TAKE ONE TABLET BY MOUTH ONCE DAILY BEFORE A MEAL TO CONTROL STOMACH ACID, Disp: , Rfl:    predniSONE (DELTASONE) 10 MG tablet, Take 2 tablets (20 mg total) by mouth daily., Disp: 15 tablet, Rfl: 0   terazosin (HYTRIN) 10 MG capsule, Take 1 capsule by mouth at bedtime., Disp: , Rfl:   Social History   Tobacco Use  Smoking Status Former  Smokeless Tobacco Never    Allergies  Allergen Reactions   Cholecalciferol     Other reaction(s): Constipation   Glucophage [Metformin] Diarrhea   Drug Class [Niacin And Related] Rash   Niacin     Other reaction(s): [place]   Objective:  There were no vitals filed for this visit. There is no height or  weight on file to calculate BMI. Constitutional Well developed. Well nourished.  Vascular Foot warm and well perfused. Capillary refill normal to all digits.   Neurologic Normal speech. Oriented to person, place, and time. Epicritic sensation to light touch grossly present bilaterally.  Dermatologic Skin healing well without signs of infection. Skin edges well coapted without signs of infection.  Orthopedic: Tenderness to palpation noted about the surgical site.    Cultures from 12/29/22 growing staph lugdunensis  No results from culture and pathology from 01/24/23 available in chart.   Assessment:   1. Superficial incisional surgical site infection    Plan:  Patient was evaluated and treated and all questions  answered.  S/p foot surgery right -Progressing as expected post-operatively. -WB Status: WBAT in surgical shoe -Sutures: removed today without incident. . -Foot redressed. Continue daily betadine. .  Return in 2 weeks with Dr. Lilian Kapur.   Return in about 2 weeks (around 02/28/2023) for post op.

## 2023-02-15 ENCOUNTER — Ambulatory Visit (INDEPENDENT_AMBULATORY_CARE_PROVIDER_SITE_OTHER): Payer: No Typology Code available for payment source | Admitting: Internal Medicine

## 2023-02-15 ENCOUNTER — Encounter: Payer: Self-pay | Admitting: Internal Medicine

## 2023-02-15 ENCOUNTER — Other Ambulatory Visit: Payer: Self-pay

## 2023-02-15 VITALS — BP 143/66 | HR 64 | Temp 98.3°F | Wt 175.0 lb

## 2023-02-15 DIAGNOSIS — M86671 Other chronic osteomyelitis, right ankle and foot: Secondary | ICD-10-CM

## 2023-02-15 DIAGNOSIS — M869 Osteomyelitis, unspecified: Secondary | ICD-10-CM | POA: Insufficient documentation

## 2023-02-15 MED ORDER — DOXYCYCLINE HYCLATE 100 MG PO TABS: 100.0000 mg | ORAL_TABLET | Freq: Two times a day (BID) | ORAL | 0 refills | Status: AC

## 2023-02-15 NOTE — Progress Notes (Signed)
[facility]  Reason for Consult: Osteomyelitis  Referring Provider: Dr Lilian Kapur   HPI:    Ernest Walker is a 77 y.o. male with PMHx as below who presents to the clinic for osteomyelitis.   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  ABI's in August 2023 were normal.  He has followed with podiatry and underwent shaving of bone on 12/09/22 and had fluid from wound drainage cultured in March that grew Staph lugdunensis (Oxacillin Resistant).  Following MRI results he had more extensive I&D on 01/24/23.  Those cultures from I&D and biopsy are not readily available in Epic. He has been maintained on oral antibiotics with Doxycycline but ran out about 5 days ago.  He reports today that he is doing well and not in pain.  Wound is healing well per his report and he had stitches removed yesterday.  He is still in a walking boot per podiatry recommendations.  Patient's Medications  New Prescriptions   No medications on file  Previous Medications   AMLODIPINE (NORVASC) 10 MG TABLET    TAKE ONE-HALF TABLET BY MOUTH EVERY DAY FOR BLOOD PRESSURE   ASPIRIN 81 MG CHEWABLE TABLET    CHEW ONE TABLET BY MOUTH EVERY DAY ASK YOUR DOCTOR HOW LONG YOU SHOULD TAKE THIS MEDICATION   ATORVASTATIN (LIPITOR) 20 MG TABLET    Take 20 mg by mouth daily.   ATORVASTATIN (LIPITOR) 80 MG TABLET    TAKE ONE-HALF TABLET BY MOUTH AT BEDTIME FOR CHOLESTEROL   CALCITRIOL (ROCALTROL) 0.25 MCG CAPSULE    Take 1 capsule by mouth 2 (two) times daily.   EMPAGLIFLOZIN (JARDIANCE) 25 MG TABS TABLET    Take 0.5 tablets by mouth daily.   FERROUS SULFATE 325 (65 FE) MG TABLET    Take 325 mg by mouth See admin instructions. Take one tablet on Monday, Wednesday, and Friday   INSULIN GLARGINE-YFGN (SEMGLEE) 100 UNIT/ML PEN    INJECT 38 UNITS UNDER SKIN EVERY MORNING BEFORE BREAKFAST FOR DIABETES.DISCARD PEN 28 DAYS AFTER OPENING**REPLACES  LANTUS**   LISINOPRIL (ZESTRIL) 40 MG TABLET    TAKE ONE-HALF TABLET BY MOUTH EVERY DAY FOR BLOOD PRESSURE   METOPROLOL (LOPRESSOR) 50 MG TABLET    Take 50 mg by mouth 2 (two) times daily.   NON FORMULARY    [facility]  ANTIFUNGAL (NAIL)-#1   ONDANSETRON (ZOFRAN) 8 MG TABLET    TAKE ONE-HALF TABLET BY MOUTH EVERY 8 HOURS AS NEEDED FOR NAUSEA   PANTOPRAZOLE (PROTONIX) 40 MG TABLET    TAKE ONE TABLET BY MOUTH ONCE DAILY BEFORE A MEAL TO CONTROL STOMACH ACID   PREDNISONE (DELTASONE) 10 MG TABLET    Take 2 tablets (20 mg total) by mouth daily.   TERAZOSIN (HYTRIN) 10 MG CAPSULE    Take 1 capsule by mouth at bedtime.  Modified Medications   Modified Medication Previous Medication   DOXYCYCLINE (VIBRA-TABS) 100 MG TABLET doxycycline (VIBRA-TABS) 100 MG tablet      Take 1 tablet (100 mg total) by mouth 2 (two) times daily.    Take 1 tablet (100 mg total) by mouth 2 (two) times daily.  Discontinued Medications   No medications on file      Past Medical History:  Diagnosis Date   Arthritis    hands, knees   Carpal tunnel syndrome of right wrist    Diabetes mellitus without complication    Hypertension  Prostate cancer 2007   radiation implant done at [facility] in [facility]    Social History   Tobacco Use   Smoking status: Former   Smokeless tobacco: Never  Building services engineer Use: Never used  Substance Use Topics   Alcohol use: No   Drug use: No    Family History  Family history unknown: Yes    Allergies  Allergen Reactions   Cholecalciferol     Other reaction(s): Constipation   Glucophage [Metformin] Diarrhea   Drug Class [Niacin And Related] Rash   Niacin     Other reaction(s): Flushing    Review of Systems  Constitutional: Negative.   Gastrointestinal: Negative.   All other systems reviewed and are negative.     OBJECTIVE:    Vitals:   02/15/23 0936  BP: (!) 143/66  Pulse: 64  Temp: 98.3 F (36.8 C)  TempSrc: Oral  SpO2: 100%  Weight: 175 lb (79.4  kg)     Body mass index is 25.11 kg/m.  Physical Exam Constitutional:      Appearance: Normal appearance.  HENT:     Head: Normocephalic and atraumatic.  Eyes:     Extraocular Movements: Extraocular movements intact.     Conjunctiva/sclera: Conjunctivae normal.  Pulmonary:     Effort: Pulmonary effort is normal. No respiratory distress.  Abdominal:     General: There is no distension.     Palpations: Abdomen is soft.  Musculoskeletal:     Comments: Right foot in Darco boot.   Skin:    General: Skin is warm and dry.  Neurological:     General: No focal deficit present.     Mental Status: He is alert and oriented to person, place, and time.  Psychiatric:        Mood and Affect: Mood normal.        Behavior: Behavior normal.      Labs and Microbiology:     Latest Ref Rng & Units 01/06/2023   12:55 PM 12/05/2020    6:46 AM 12/03/2020    3:08 AM  CBC  WBC 3.4 - 10.8 x10E3/uL 8.5  9.3  7.7   Hemoglobin 13.0 - 17.7 g/dL 8.4  9.6  9.1   Hematocrit 37.5 - 51.0 % 28.8  31.7  29.2   Platelets 150 - 450 x10E3/uL 350  293  288       Latest Ref Rng & Units 12/05/2020    4:38 AM 12/04/2020    6:59 AM 12/03/2020    3:08 AM  CMP  Glucose 70 - 99 mg/dL 161  096  045   BUN 8 - 23 mg/dL 28  25  25    Creatinine 0.61 - 1.24 mg/dL 4.09  8.11  9.14   Sodium 135 - 145 mmol/L 139  139  139   Potassium 3.5 - 5.1 mmol/L 4.9  4.9  4.4   Chloride 98 - 111 mmol/L 113  113  113   CO2 22 - 32 mmol/L 17  18  18    Calcium 8.9 - 10.3 mg/dL 8.9  8.9  8.3      No results found for this or any previous visit (from the past 240 hour(s)).   ASSESSMENT & PLAN:    Pyogenic inflammation of bone   Patient here today for evaluation of diabetic foot wound complicated by osteomyelitis of the right foot.  Prior drainage cultures grew Staph lugdunensis and he has been doing well on doxycycline.  His OR cultures  from extensive I&D on 01/24/23 are not available in Epic but prior notes indicate they were no  growth.  Will check his labs today and continue doxycycline for approximately 4 more weeks to complete at least 6 weeks of therapy from date of his I&D.  Will follow up in 4 weeks to see how he is improving and at that time consider stopping antibiotics if improved.   Orders Placed This Encounter  Procedures   Sedimentation rate   C-reactive protein    Vedia Coffer for Infectious Disease [facility] 02/15/2023, 10:02 AM  I have personally spent 30 minutes involved in face-to-face and non-face-to-face activities for this patient on the day of the visit. Professional time spent includes the following activities: Preparing to see the patient (review of tests), Obtaining and/or reviewing separately obtained history (admission/discharge record), Performing a medically appropriate examination and/or evaluation , Ordering medications/tests/procedures, referring and communicating with other health care professionals, Documenting clinical information in the EMR, Independently interpreting results (not separately reported), Communicating results to the patient/family/caregiver, Counseling and educating the patient/family/caregiver and Care coordination (not separately reported).

## 2023-02-15 NOTE — Patient Instructions (Signed)
Thank you for coming to see me today. It was a pleasure seeing you.  To Do: Resume Doxycycline  Twice Daily Continue Podiatry follow up See me in 4 weeks  If you have any questions or concerns, please do not hesitate to call the office at [phone].  Take Care,   Gwynn Burly

## 2023-02-15 NOTE — Assessment & Plan Note (Signed)
  Patient here today for evaluation of diabetic foot wound complicated by osteomyelitis of the right foot.  Prior drainage cultures grew Staph lugdunensis and he has been doing well on doxycycline.  His OR cultures from extensive I&D on 01/24/23 are not available in Epic but prior notes indicate they were no growth.  Will check his labs today and continue doxycycline for approximately 4 more weeks to complete at least 6 weeks of therapy from date of his I&D.  Will follow up in 4 weeks to see how he is improving and at that time consider stopping antibiotics if improved.

## 2023-02-16 LAB — C-REACTIVE PROTEIN: CRP: <3 mg/L (ref <8.0)

## 2023-02-16 LAB — SEDIMENTATION RATE: Sed Rate: 29 mm/h — ABNORMAL HIGH (ref 0–20)

## 2023-03-07 ENCOUNTER — Ambulatory Visit (INDEPENDENT_AMBULATORY_CARE_PROVIDER_SITE_OTHER): Payer: No Typology Code available for payment source | Admitting: Podiatry

## 2023-03-07 ENCOUNTER — Ambulatory Visit (INDEPENDENT_AMBULATORY_CARE_PROVIDER_SITE_OTHER): Payer: No Typology Code available for payment source

## 2023-03-07 DIAGNOSIS — E08621 Diabetes mellitus due to underlying condition with foot ulcer: Secondary | ICD-10-CM

## 2023-03-07 DIAGNOSIS — M2012 Hallux valgus (acquired), left foot: Secondary | ICD-10-CM

## 2023-03-07 DIAGNOSIS — M2011 Hallux valgus (acquired), right foot: Secondary | ICD-10-CM | POA: Diagnosis not present

## 2023-03-07 DIAGNOSIS — L97411 Non-pressure chronic ulcer of right heel and midfoot limited to breakdown of skin: Secondary | ICD-10-CM | POA: Diagnosis not present

## 2023-03-07 NOTE — Progress Notes (Signed)
  Subjective:  Patient ID: Ernest Walker, male    DOB: 1945-12-16,  MRN: [id number]  Chief Complaint  Patient presents with   Routine Post Op    POV # 3 DOS 01/24/23 --- INCISION AND DRAINAGE OF RIGHT FOOT INFECTION WITH BIOPSY AND CULTURE     77 y.o. male returns for post-op check.  He is not having any pain.  He notices the toe drifting over.  He is still taking his antibiotics.  Has not had any drainage  Review of Systems: Negative except as noted in the HPI. Denies N/V/F/Ch.   Objective:   There were no vitals filed for this visit.  There is no height or weight on file to calculate BMI. Constitutional Well developed. Well nourished.  Vascular Foot warm and well perfused. Capillary refill normal to all digits.  Calf is soft and supple, no posterior calf or knee pain, negative Homans' sign  Neurologic Normal speech. Oriented to person, place, and time. Epicritic sensation to light touch grossly present bilaterally.  Dermatologic No ulceration or callus, incision has healed well without complication  Orthopedic: He has mild edema and no tenderness to palpation noted about the surgical site.  EHL is both strong and dorsal flexing and abducting hallux   Multiple view plain film radiographs: Radiographs taken today show worsening hallux valgus deformity, good resorption of antibiotic beads with integration into metatarsal canal, there is some cystic change in the proximal phalanx  Pathology results finalized with osteomyelitis  Joint fluid culture taken 01/24/2023 finalized no growth Bone culture taken 01/24/2023 finalized growth  Assessment:   1. Diabetic ulcer of right midfoot associated with diabetes mellitus due to underlying condition, limited to breakdown of skin (HCC)   2. Hallux valgus, acquired, bilateral    Plan:  Patient was evaluated and treated and all questions answered.  S/p foot surgery right -Soft tissue has healed well.  His radiographs do show good  integration of the antibiotic beads into the surrounding bone.  There is some cystic change in the proximal phalanx. -He will continue his antibiotics until his infection disease follow-up.  Hopefully can discontinue at that point -I will see him in 4 weeks for new radiographs -Long-term his deformity will need to be addressed.  Hopefully can work on soft tissue rebalancing with EHL lengthening or tenotomy.  Would like to avoid fusion if possible considering history of infection -May return to regular shoe gear does not need bandaging.  He is going to try a bunion reduction splint to see if this helps stretch out any of the deformity.  Advised him to check skin daily if he is using this for any pressure areas  Return in about 4 weeks (around 04/04/2023) for post op (new x-rays).

## 2023-03-10 ENCOUNTER — Ambulatory Visit: Payer: No Typology Code available for payment source | Admitting: Podiatry

## 2023-03-27 ENCOUNTER — Encounter: Payer: Self-pay | Admitting: Family

## 2023-03-27 ENCOUNTER — Other Ambulatory Visit: Payer: Self-pay

## 2023-03-27 ENCOUNTER — Ambulatory Visit (INDEPENDENT_AMBULATORY_CARE_PROVIDER_SITE_OTHER): Payer: No Typology Code available for payment source | Admitting: Family

## 2023-03-27 ENCOUNTER — Encounter: Payer: Self-pay | Admitting: Podiatry

## 2023-03-27 VITALS — BP 145/69 | HR 57 | Temp 98.0°F | Ht 69.5 in | Wt 173.0 lb

## 2023-03-27 DIAGNOSIS — M86671 Other chronic osteomyelitis, right ankle and foot: Secondary | ICD-10-CM | POA: Diagnosis not present

## 2023-03-27 NOTE — Progress Notes (Signed)
Subjective:    Patient ID: [name], male    DOB: [birth year], 77 y.o.   MRN: [id number]  Chief Complaint  Patient presents with   Follow-up    Diabetic foot ulcer complicated by osteomyelitis    HPI:  Ernest Walker is a 77 y.o. 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 Earlene Plater on 02/15/23 with good adherence and tolerance to doxycycline for Oxacillin Resistant Staph lugdunensis infection. Inflammatory markers were improved with CRP <3.0 and Sed rate 29 (down from 111 on 3/15). Here today for follow up.  Mr. Ernest Walker has been doing well since his last visit and completed course of doxycycline about 3 days ago. Surgical incision has healed. Right foot is mildly swollen with biggest concern being the hallux valgus curvature of his foot. Occasional pain with no systemic symptoms of fevers or chills.   Allergies  Allergen Reactions   Cholecalciferol     Other reaction(s): Constipation   Glucophage [Metformin] Diarrhea   Drug Class [Niacin And Related] Rash   Niacin     Other reaction(s): Flushing      Outpatient Medications Prior to Visit  Medication Sig Dispense Refill   amLODipine (NORVASC) 10 MG tablet TAKE ONE-HALF TABLET BY MOUTH EVERY DAY FOR BLOOD PRESSURE     aspirin 81 MG chewable tablet CHEW ONE TABLET BY MOUTH EVERY DAY ASK YOUR DOCTOR HOW LONG YOU SHOULD TAKE THIS MEDICATION     atorvastatin (LIPITOR) 20 MG tablet Take 20 mg by mouth daily.     atorvastatin (LIPITOR) 80 MG tablet TAKE ONE-HALF TABLET BY MOUTH AT BEDTIME FOR CHOLESTEROL     calcitRIOL (ROCALTROL) 0.25 MCG capsule Take 1 capsule by mouth 2 (two) times daily.     empagliflozin (JARDIANCE) 25 MG TABS tablet Take 0.5 tablets by mouth daily.     ferrous sulfate 325 (65 FE) MG tablet Take 325 mg by mouth See admin instructions. Take one tablet on Monday, Wednesday, and Friday     insulin glargine-yfgn (SEMGLEE) 100 UNIT/ML Pen INJECT 38  UNITS UNDER SKIN EVERY MORNING BEFORE BREAKFAST FOR DIABETES.DISCARD PEN 28 DAYS AFTER OPENING**REPLACES LANTUS**     lisinopril (ZESTRIL) 40 MG tablet TAKE ONE-HALF TABLET BY MOUTH EVERY DAY FOR BLOOD PRESSURE     metoprolol (LOPRESSOR) 50 MG tablet Take 50 mg by mouth 2 (two) times daily.     NON FORMULARY [facility]  ANTIFUNGAL (NAIL)-#1     ondansetron (ZOFRAN) 8 MG tablet TAKE ONE-HALF TABLET BY MOUTH EVERY 8 HOURS AS NEEDED FOR NAUSEA     pantoprazole (PROTONIX) 40 MG tablet TAKE ONE TABLET BY MOUTH ONCE DAILY BEFORE A MEAL TO CONTROL STOMACH ACID     predniSONE (DELTASONE) 10 MG tablet Take 2 tablets (20 mg total) by mouth daily. 15 tablet 0   terazosin (HYTRIN) 10 MG capsule Take 1 capsule by mouth at bedtime.     No facility-administered medications prior to visit.     Past Medical History:  Diagnosis Date   Arthritis    hands, knees   Carpal tunnel syndrome of right wrist    Diabetes mellitus without complication (HCC)    Hypertension    Prostate cancer (HCC) 2007   radiation implant done at [facility] in [facility]     Past Surgical History:  Procedure Laterality Date   CARPAL TUNNEL RELEASE Right 08/18/2015   Procedure: RIGHT CARPAL TUNNEL RELEASE;  Surgeon: Ernest Salt, MD;  Location:  [facility];  Service: Orthopedics;  Laterality: Right;  block in preop   INCISION AND DRAINAGE DEEP NECK ABSCESS     INSERTION PROSTATE RADIATION SEED  2007   at [place] in [facility] NERVE TRANSPOSITION Right 08/18/2015   Procedure:  DECOMPRESSION ULNAR NERVE RIGHT WRIST WITH DEBRIDEMENT ULNAR NERVE;  Surgeon: Ernest Salt, MD;  Location:  SURGERY CENTER;  Service: Orthopedics;  Laterality: Right;  block in preop       Review of Systems  Constitutional:  Negative for chills, diaphoresis, fatigue and fever.  Respiratory:  Negative for cough, chest tightness, shortness of breath and wheezing.   Cardiovascular:  Negative for chest pain.  Gastrointestinal:   Negative for abdominal pain, diarrhea, nausea and vomiting.      Objective:    BP (!) 145/69   Pulse (!) 57   Temp 98 F (36.7 C) (Temporal)   Ht 5' 9.5" (1.765 m)   Wt 173 lb (78.5 kg)   SpO2 99%   BMI 25.18 kg/m  Nursing note and vital signs reviewed.  Physical Exam Constitutional:      General: He is not in acute distress.    Appearance: He is well-developed.  Cardiovascular:     Rate and Rhythm: Normal rate and regular rhythm.     Heart sounds: Normal heart sounds.  Pulmonary:     Effort: Pulmonary effort is normal.     Breath sounds: Normal breath sounds.  Musculoskeletal:     Comments: Right great toe with hallux valgus and protector on second toe. Surgical incision is closed and well healed. There is mild edema on the dorsum of the right foot.   Skin:    General: Skin is warm and dry.  Neurological:     Mental Status: He is alert.  Psychiatric:        Mood and Affect: Mood normal.         03/27/2023   11:17 AM 02/15/2023    9:38 AM  Depression screen PHQ 2/9  Decreased Interest 0 0  Down, Depressed, Hopeless 0 0  PHQ - 2 Score 0 0       Assessment & Plan:    Patient Active Problem List   Diagnosis Date Noted   Pyogenic inflammation of bone (HCC) 02/15/2023   Gastro-esophageal reflux disease with esophagitis, without bleeding 02/07/2022   Gout 02/07/2022   Incipient senile cataract 02/07/2022   Low-tension glaucoma, right eye, mild stage 02/07/2022   Onychomycosis 02/07/2022   Other ill-defined and unknown causes of morbidity and mortality 02/07/2022   Prostate cancer (HCC) 11/01/2021   CKD (chronic kidney disease) 11/01/2021   Low back pain 11/01/2021   Diabetic neuropathy (HCC) 11/01/2021   Diabetic retinopathy (HCC) 11/01/2021   Age-related nuclear cataract, bilateral 11/01/2021   Allergic rhinitis 11/01/2021   COVID-19 virus infection    Bacteremia    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 (HCC) 11/29/2020   Carpal  tunnel syndrome of right wrist 04/01/2019   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   Diabetes mellitus without complication (HCC) 09/25/2015   Decreased ROM of wrist 09/14/2015   Decreased sensation of hand and upper extremity 09/14/2015   Decreased strength, endurance, and mobility 09/14/2015   OTHER DISEASES OF LARYNX 03/24/2009   HYPERLIPIDEMIA 03/10/2009   HYPERTENSION 03/10/2009   PULMONARY NODULE 03/10/2009   DYSPNEA ON EXERTION 03/10/2009     Problem List Items Addressed This Visit       Musculoskeletal  and Integument   Pyogenic inflammation of bone (HCC) - Primary    Mr. Lindemann has completed approximately 6 weeks of antibiotics with good adherence and tolerance. Surgical incision is well healed and there is currently no clear evidence of infection and suspect edema is generalized and not infection related. Check inflammatory markers. Will stop antibiotics and monitor at this time. Continue to follow up with Podiatry as scheduled. Follow up with ID as needed should symptoms return.       Relevant Orders   Sedimentation rate   C-reactive protein     I am having Sherilyn Cooter T. Lounds maintain his atorvastatin, metoprolol tartrate, NON FORMULARY, ferrous sulfate, empagliflozin, amLODipine, aspirin, atorvastatin, calcitRIOL, insulin glargine-yfgn, lisinopril, ondansetron, pantoprazole, terazosin, and predniSONE.   Follow-up: Follow up with ID as needed pending lab work results.    Marcos Eke, MSN, FNP-C Nurse Practitioner [facility] for Infectious Disease [facility] Main number: [phone]

## 2023-03-27 NOTE — Patient Instructions (Signed)
Nice to see you.  We will check your lab work today.  Pending lab work, no further antibiotics are needed at this time and continue with wound care per Podiatry.  Follow up with ID as needed.   Have a great day and stay safe!

## 2023-03-27 NOTE — Assessment & Plan Note (Signed)
Ernest Walker has completed approximately 6 weeks of antibiotics with good adherence and tolerance. Surgical incision is well healed and there is currently no clear evidence of infection and suspect edema is generalized and not infection related. Check inflammatory markers. Will stop antibiotics and monitor at this time. Continue to follow up with Podiatry as scheduled. Follow up with ID as needed should symptoms return.

## 2023-03-28 LAB — C-REACTIVE PROTEIN: CRP: <3 mg/L (ref <8.0)

## 2023-03-28 LAB — SEDIMENTATION RATE: Sed Rate: 19 mm/h (ref 0–20)

## 2023-03-29 ENCOUNTER — Ambulatory Visit (INDEPENDENT_AMBULATORY_CARE_PROVIDER_SITE_OTHER): Payer: No Typology Code available for payment source | Admitting: Podiatry

## 2023-03-29 ENCOUNTER — Encounter: Payer: Self-pay | Admitting: Podiatry

## 2023-03-29 VITALS — BP 141/56

## 2023-03-29 DIAGNOSIS — E1149 Type 2 diabetes mellitus with other diabetic neurological complication: Secondary | ICD-10-CM

## 2023-03-29 DIAGNOSIS — Z8631 Personal history of diabetic foot ulcer: Secondary | ICD-10-CM

## 2023-03-29 DIAGNOSIS — M79674 Pain in right toe(s): Secondary | ICD-10-CM

## 2023-03-29 DIAGNOSIS — M79675 Pain in left toe(s): Secondary | ICD-10-CM

## 2023-03-29 DIAGNOSIS — B351 Tinea unguium: Secondary | ICD-10-CM | POA: Diagnosis not present

## 2023-03-29 DIAGNOSIS — M2012 Hallux valgus (acquired), left foot: Secondary | ICD-10-CM

## 2023-03-29 DIAGNOSIS — E119 Type 2 diabetes mellitus without complications: Secondary | ICD-10-CM

## 2023-03-30 ENCOUNTER — Ambulatory Visit (INDEPENDENT_AMBULATORY_CARE_PROVIDER_SITE_OTHER): Payer: No Typology Code available for payment source | Admitting: Podiatry

## 2023-03-30 ENCOUNTER — Telehealth: Payer: Self-pay

## 2023-03-30 ENCOUNTER — Ambulatory Visit: Payer: No Typology Code available for payment source

## 2023-03-30 DIAGNOSIS — E08621 Diabetes mellitus due to underlying condition with foot ulcer: Secondary | ICD-10-CM

## 2023-03-30 DIAGNOSIS — M2011 Hallux valgus (acquired), right foot: Secondary | ICD-10-CM

## 2023-03-30 DIAGNOSIS — L97411 Non-pressure chronic ulcer of right heel and midfoot limited to breakdown of skin: Secondary | ICD-10-CM | POA: Diagnosis not present

## 2023-03-30 DIAGNOSIS — M2012 Hallux valgus (acquired), left foot: Secondary | ICD-10-CM

## 2023-03-30 NOTE — Telephone Encounter (Signed)
-----   Message from Veryl Speak, FNP sent at 03/30/2023  4:14 PM EDT ----- Please inform Mr. Braden that his inflammatory markers look good and normal. Continue to follow up with Podiatry and follow up with ID as needed. Thanks.

## 2023-03-30 NOTE — Telephone Encounter (Signed)
Patient aware of results and voiced his understanding. ° °Can Lucci P Decarlo Rivet, CMA ° °

## 2023-04-03 NOTE — Progress Notes (Signed)
  Subjective:  Patient ID: Ernest Walker, male    DOB: 11-19-1945,  MRN: [id number]  Chief Complaint  Patient presents with   Routine Post Op    POV # 4 DOS 01/24/23 --- INCISION AND DRAINAGE OF RIGHT FOOT INFECTION WITH BIOPSY AND CULTURE     77 y.o. male returns for post-op check.  He is doing well not having any pain.  Finished his antibiotics at this point, he saw infectious disease again and they took lab work.  Review of Systems: Negative except as noted in the HPI. Denies N/V/F/Ch.   Objective:   There were no vitals filed for this visit.  There is no height or weight on file to calculate BMI. Constitutional Well developed. Well nourished.  Vascular Foot warm and well perfused. Capillary refill normal to all digits.  Calf is soft and supple, no posterior calf or knee pain, negative Homans' sign  Neurologic Normal speech. Oriented to person, place, and time. Epicritic sensation to light touch grossly present bilaterally.  Dermatologic No ulceration or callus, incision has healed well without complication.  No signs of infection.  Orthopedic: Little to no edema.  No pain to palpation.  Strong bow flexing of the EHL and EHB   Multiple view plain film radiographs: Radiographs taken today show stable hallux valgus deformity, unchanged bony alignment and appearance, no increase in cyst or bony lysis  Pathology results finalized with osteomyelitis  Joint fluid culture taken 01/24/2023 finalized no growth Bone culture taken 01/24/2023 finalized growth  ESR and CRP on 03/27/2023 have normalized   Assessment:   1. Diabetic ulcer of right midfoot associated with diabetes mellitus due to underlying condition, limited to breakdown of skin (HCC)   2. Hallux valgus, acquired, bilateral    Plan:  Patient was evaluated and treated and all questions answered.  S/p foot surgery right At this point I do think his infection is under control.  As result of the wound and loss of the  sesamoid complex control, the hallux does appear to be in a dorsiflexed and abducted hallux valgus position.  He has tight bowstringing of the extensor tendons.  Currently it is not bothering him from a pain standpoint and he is utilizing silicone padding to prevent callus or ulceration formation  he will continue to use this.  We discussed corrective surgeries with extensor tenotomy could be undertaken in the office to help alleviate this, arthrodesis of the joint would need repeat biopsy and new MRI prior to proceeding and he likely does not want to go through with a major surgery such as this.  I will see him back in 2 months.  No follow-ups on file.

## 2023-04-04 NOTE — Progress Notes (Signed)
ANNUAL DIABETIC FOOT EXAM  Subjective: Ernest Walker presents today annual diabetic foot exam.  Chief Complaint  Patient presents with   Nail Problem    DFC,A1C:7.7,B/S:84 today,   Patient confirms h/o diabetes.  Patient has h/o foot/leg ulcer of right foot.  Patient has been diagnosed with neuropathy.  He has had surgery on right foot to alleviate plantar ulcer. He is under the care of Dr. Lilian Kapur. He did have a postop infection which is being managed by ID. He remains under care of Dr. Lilian Kapur.  Risk factors: diabetes, diabetic neuropathy, history of gout, HTN, CKD, hyperlipidemia, h/o tobacco use in remission.  [facility] is patient's PCP.  Past Medical History:  Diagnosis Date   Arthritis    hands, knees   Carpal tunnel syndrome of right wrist    Diabetes mellitus without complication (HCC)    Hypertension    Prostate cancer (HCC) 2007   radiation implant done at [facility] in [facility]   Patient Active Problem List   Diagnosis Date Noted   Pyogenic inflammation of bone (HCC) 02/15/2023   Gastro-esophageal reflux disease with esophagitis, without bleeding 02/07/2022   Gout 02/07/2022   Incipient senile cataract 02/07/2022   Low-tension glaucoma, right eye, mild stage 02/07/2022   Onychomycosis 02/07/2022   Other ill-defined and unknown causes of morbidity and mortality 02/07/2022   Prostate cancer (HCC) 11/01/2021   CKD (chronic kidney disease) 11/01/2021   Low back pain 11/01/2021   Diabetic neuropathy (HCC) 11/01/2021   Diabetic retinopathy (HCC) 11/01/2021   Age-related nuclear cataract, bilateral 11/01/2021   Allergic rhinitis 11/01/2021   COVID-19 virus infection    Bacteremia    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 (HCC) 11/29/2020   Carpal tunnel syndrome of right wrist 04/01/2019   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   Diabetes mellitus without complication (HCC) 09/25/2015   Decreased ROM of wrist 09/14/2015   Decreased  sensation of hand and upper extremity 09/14/2015   Decreased strength, endurance, and mobility 09/14/2015   OTHER DISEASES OF LARYNX 03/24/2009   HYPERLIPIDEMIA 03/10/2009   HYPERTENSION 03/10/2009   PULMONARY NODULE 03/10/2009   DYSPNEA ON EXERTION 03/10/2009   Past Surgical History:  Procedure Laterality Date   CARPAL TUNNEL RELEASE Right 08/18/2015   Procedure: RIGHT CARPAL TUNNEL RELEASE;  Surgeon: Cindee Salt, MD;  Location: [facility];  Service: Orthopedics;  Laterality: Right;  block in preop   INCISION AND DRAINAGE DEEP NECK ABSCESS     INSERTION PROSTATE RADIATION SEED  2007   at [place] in [facility] NERVE TRANSPOSITION Right 08/18/2015   Procedure:  DECOMPRESSION ULNAR NERVE RIGHT WRIST WITH DEBRIDEMENT ULNAR NERVE;  Surgeon: Cindee Salt, MD;  Location: [facility];  Service: Orthopedics;  Laterality: Right;  block in preop   Current Outpatient Medications on File Prior to Visit  Medication Sig Dispense Refill   amLODipine (NORVASC) 10 MG tablet TAKE ONE-HALF TABLET BY MOUTH EVERY DAY FOR BLOOD PRESSURE     aspirin 81 MG chewable tablet CHEW ONE TABLET BY MOUTH EVERY DAY ASK YOUR DOCTOR HOW LONG YOU SHOULD TAKE THIS MEDICATION     atorvastatin (LIPITOR) 20 MG tablet Take 20 mg by mouth daily.     atorvastatin (LIPITOR) 80 MG tablet TAKE ONE-HALF TABLET BY MOUTH AT BEDTIME FOR CHOLESTEROL     calcitRIOL (ROCALTROL) 0.25 MCG capsule Take 1 capsule by mouth 2 (two) times daily.     empagliflozin (JARDIANCE) 25 MG TABS  tablet Take 0.5 tablets by mouth daily.     ferrous sulfate 325 (65 FE) MG tablet Take 325 mg by mouth See admin instructions. Take one tablet on Monday, Wednesday, and Friday     insulin glargine-yfgn (SEMGLEE) 100 UNIT/ML Pen INJECT 38 UNITS UNDER SKIN EVERY MORNING BEFORE BREAKFAST FOR DIABETES.DISCARD PEN 28 DAYS AFTER OPENING**REPLACES LANTUS**     lisinopril (ZESTRIL) 40 MG tablet TAKE ONE-HALF TABLET BY MOUTH EVERY DAY FOR BLOOD  PRESSURE     metoprolol (LOPRESSOR) 50 MG tablet Take 50 mg by mouth 2 (two) times daily.     NON FORMULARY [facility]  ANTIFUNGAL (NAIL)-#1     ondansetron (ZOFRAN) 8 MG tablet TAKE ONE-HALF TABLET BY MOUTH EVERY 8 HOURS AS NEEDED FOR NAUSEA     pantoprazole (PROTONIX) 40 MG tablet TAKE ONE TABLET BY MOUTH ONCE DAILY BEFORE A MEAL TO CONTROL STOMACH ACID     predniSONE (DELTASONE) 10 MG tablet Take 2 tablets (20 mg total) by mouth daily. 15 tablet 0   terazosin (HYTRIN) 10 MG capsule Take 1 capsule by mouth at bedtime.     No current facility-administered medications on file prior to visit.    Allergies  Allergen Reactions   Cholecalciferol     Other reaction(s): Constipation   Glucophage [Metformin] Diarrhea   Drug Class [Niacin And Related] Rash   Niacin     Other reaction(s): Flushing   Social History   Occupational History   Not on file  Tobacco Use   Smoking status: Former   Smokeless tobacco: Never  Vaping Use   Vaping Use: Never used  Substance and Sexual Activity   Alcohol use: No   Drug use: No   Sexual activity: Not on file   Family History  Family history unknown: Yes   Immunization History  Administered Date(s) Administered   COVID-19, mRNA, vaccine(Comirnaty)12 years and older 11/28/2022     Review of Systems: Negative except as noted in the HPI.   Objective: Vitals:   03/29/23 1500  BP: (!) 141/56    Ernest Walker is a pleasant 77 y.o. male in NAD. AAO X 3. Vascular Examination: Capillary refill time <3 seconds b/l. Vascular status intact b/l with palpable pedal pulses. Pedal hair decreased b/l. No pain with calf compression b/l. Skin temperature gradient WNL b/l. No cyanosis or clubbing b/l. No ischemia or gangrene noted b/l. No edema noted b/l LE.  Neurological Examination: Protective sensation diminished with 10g monofilament b/l.  Dermatological Examination: Pedal skin with normal turgor, texture and tone b/l.  No open wounds. No  interdigital macerations.   Toenails 1-5 b/l thick, discolored, elongated with subungual debris and pain on dorsal palpation.   Callus resolved submet head 1 right foot. No open wounds b/l LE. No interdigital macerations noted b/l LE. Toenails 1-5 b/l well maintained with adequate length. No erythema, no edema, no drainage, no fluctuance.  Musculoskeletal Examination: Crossover bunion deformity right foot. Hammertoe deformity noted 2-5 b/l. Pes planus deformity noted bilateral LE.  Radiographs: None  Lab Results  Component Value Date   HGBA1C 7.7 (H) 11/29/2020   ADA Risk Categorization: High Risk  Patient has one or more of the following: Loss of protective sensation Absent pedal pulses Severe Foot deformity History of foot ulcer  Assessment: 1. Pain due to onychomycosis of toenails of both feet   2. History of diabetic ulcer of foot   3. Hallux valgus, acquired, bilateral   4. 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 Encounter for diabetic foot exam [facility])     Plan: -Patient was evaluated and treated. All patient's and/or POA's questions/concerns answered on today's visit. -Diabetic foot examination performed today. -Continue diabetic foot care principles: inspect feet daily, monitor glucose as recommended by PCP and/or Endocrinologist, and follow prescribed diet per PCP, Endocrinologist and/or dietician. -Patient to continue soft, supportive shoe gear daily. -Mycotic toenails 1-5 bilaterally debrided in length and girth with sterile nail nippers without iatrogenic bleeding. Patient declined use of dremel. -Patient/POA to call should there be question/concern in the interim. Return in about 3 months (around 06/29/2023).  Freddie Breech, DPM

## 2023-07-04 ENCOUNTER — Ambulatory Visit: Payer: Medicare Other | Admitting: Podiatry

## 2023-07-04 ENCOUNTER — Encounter: Payer: Self-pay | Admitting: Podiatry

## 2023-07-04 ENCOUNTER — Ambulatory Visit (INDEPENDENT_AMBULATORY_CARE_PROVIDER_SITE_OTHER): Payer: No Typology Code available for payment source

## 2023-07-04 ENCOUNTER — Ambulatory Visit (INDEPENDENT_AMBULATORY_CARE_PROVIDER_SITE_OTHER): Payer: No Typology Code available for payment source | Admitting: Podiatry

## 2023-07-04 DIAGNOSIS — B351 Tinea unguium: Secondary | ICD-10-CM

## 2023-07-04 DIAGNOSIS — M778 Other enthesopathies, not elsewhere classified: Secondary | ICD-10-CM | POA: Diagnosis not present

## 2023-07-04 DIAGNOSIS — M2011 Hallux valgus (acquired), right foot: Secondary | ICD-10-CM | POA: Diagnosis not present

## 2023-07-04 DIAGNOSIS — M79674 Pain in right toe(s): Secondary | ICD-10-CM

## 2023-07-04 DIAGNOSIS — M79675 Pain in left toe(s): Secondary | ICD-10-CM | POA: Diagnosis not present

## 2023-07-04 DIAGNOSIS — E1149 Type 2 diabetes mellitus with other diabetic neurological complication: Secondary | ICD-10-CM

## 2023-07-09 NOTE — Addendum Note (Signed)
Addended byLilian Kapur, Leia Coletti R on: 07/09/2023 03:54 PM   Modules accepted: Level of Service

## 2023-07-09 NOTE — Progress Notes (Signed)
Subjective:  Patient ID: NHIA HEYMANN, male    DOB: May 28, 1946,  MRN: [id number]  Chief Complaint  Patient presents with   Diabetes    Patient is here for routine [facility]: pt is complaing of one toe going over another toe and wants this fixed      77 y.o. male returns for post-op check.  He returns for follow-up, his nails are thickened elongated causing pain and discomfort.  Hallux continues to be a problem fitting in shoe gear  Review of Systems: Negative except as noted in the HPI. Denies N/V/F/Ch.   Objective:   There were no vitals filed for this visit.  There is no height or weight on file to calculate BMI. Constitutional Well developed. Well nourished.  Vascular Foot warm and well perfused. Capillary refill normal to all digits.  Calf is soft and supple, no posterior calf or knee pain, negative Homans' sign  Neurologic Normal speech. Oriented to person, place, and time. Epicritic sensation to light touch grossly present bilaterally.  Dermatologic .  There is no cellulitis or erythema.  He remains ulcer free.  Thickened elongated nails x 10  Orthopedic: He has no edema.  No pain to palpation.  Strong bowstring of the EHL and EHB, hallux is in extensus and abductus   Multiple view plain film radiographs: Radiographs taken today show stable hallux valgus deformity, unchanged bony alignment and appearance, no increase in cyst or bony lysis  Pathology results finalized with osteomyelitis  Joint fluid culture taken 01/24/2023 finalized no growth Bone culture taken 01/24/2023 finalized growth  ESR and CRP on 03/27/2023 have normalized   Assessment:   1. Hallux valgus (acquired), right foot    Plan:   Patient was evaluated and treated and all questions answered.  Discussed the etiology and treatment options for the condition in detail with the patient.   Recommended debridement of the nails today. Sharp and mechanical debridement performed of all painful and mycotic nails  today. Nails debrided in length and thickness using a nail nipper to level of comfort. Discussed treatment options including appropriate shoe gear. Follow up as needed for painful nails.  S/p foot surgery right He returns for follow-up and his hallux remains in a contracted extensus and abductus position.  It is causing problems in shoe gear.  Thankfully he now still remains ulcer free but likely will not continue this way as long as he has this contracture.  Suspect the majority of this is due to loss of tension of the medial sesamoid from his infection as well as the medial capsule.  He has not improved despite padding and offloading.  He is interested proceeding with surgical correction.  We discussed surgical soft tissue rebalancing including release of the lateral soft tissue structures, the extensor tendons and the long flexor tendon.  I discussed with him if we do this we will need to pin the toe in a rectus position.  We discussed the risk benefits and potential complications of the procedure including but not limited to pain, swelling, infection, scar, numbness which may be temporary or permanent, chronic pain, stiffness, nerve pain or damage, wound healing problems, bone healing problems including delayed or non-union.  He remains free of any sign of infection and is ulcer free.  All questions addressed.  Informed consent signed and reviewed.      Surgical plan:  Procedure: -Right first MTP soft tissue rebalancing with capsulotomy and extensor and flexor tendon tenotomy and pinning of toe  Location: -[facility]  Anesthesia plan: -IV sedation  Postoperative pain plan: - Tylenol 1000 mg every 6 hours, tramadol 50 mg every 8 hours as needed  DVT prophylaxis: -None required  WB Restrictions / DME needs: -WBAT in surgical shoe postop    No follow-ups on file.

## 2023-07-13 ENCOUNTER — Ambulatory Visit: Payer: Medicare Other | Admitting: Podiatry

## 2023-08-07 ENCOUNTER — Other Ambulatory Visit: Payer: Self-pay | Admitting: Podiatry

## 2023-08-07 DIAGNOSIS — M7751 Other enthesopathy of right foot: Secondary | ICD-10-CM | POA: Diagnosis not present

## 2023-08-07 DIAGNOSIS — M2011 Hallux valgus (acquired), right foot: Secondary | ICD-10-CM | POA: Diagnosis not present

## 2023-08-07 MED ORDER — DOXYCYCLINE HYCLATE 100 MG PO TABS: 100.0000 mg | ORAL_TABLET | Freq: Two times a day (BID) | ORAL | 0 refills | Status: DC

## 2023-08-07 MED ORDER — TRAMADOL HCL 50 MG PO TABS: 50.0000 mg | ORAL_TABLET | Freq: Four times a day (QID) | ORAL | 0 refills | Status: AC | PRN

## 2023-08-07 NOTE — Progress Notes (Signed)
10/14 soft tissue release 1st MTP contracture, bone biopsy, pinning of toe

## 2023-08-17 ENCOUNTER — Encounter: Payer: Self-pay | Admitting: Podiatry

## 2023-08-17 ENCOUNTER — Ambulatory Visit (INDEPENDENT_AMBULATORY_CARE_PROVIDER_SITE_OTHER): Payer: No Typology Code available for payment source

## 2023-08-17 ENCOUNTER — Ambulatory Visit: Payer: No Typology Code available for payment source | Admitting: Podiatry

## 2023-08-17 DIAGNOSIS — M2011 Hallux valgus (acquired), right foot: Secondary | ICD-10-CM

## 2023-08-17 NOTE — Progress Notes (Signed)
Subjective:  Patient ID: Ernest Walker, male    DOB: May 10, 1946,  MRN: [id number]  No chief complaint on file.   DOS: 08/07/2023 Procedure: Extensor tenotomy soft tissue capsulotomy and rebalancing of the right first MTP  77 y.o. male returns for post-op check.  Doing well not having any pain recovery has been much easier than previous surgery  Review of Systems: Negative except as noted in the HPI. Denies N/V/F/Ch.   Objective:  There were no vitals filed for this visit. There is no height or weight on file to calculate BMI. Constitutional Well developed. Well nourished.  Vascular Foot warm and well perfused. Capillary refill normal to all digits.  Calf is soft and supple, no posterior calf or knee pain, negative Homans' sign  Neurologic Normal speech. Oriented to person, place, and time. Epicritic sensation to light touch grossly present bilaterally.  Dermatologic Skin healing well without signs of infection. Skin edges well coapted without signs of infection.  Orthopedic: Hallux maintained in rectus position no complication of pin sites.  He has no pain to palpation noted about the surgical site.   Multiple view plain film radiographs: Good correction noted with Kirschner wires intact and good positioning Assessment:   1. Hallux valgus (acquired), right foot    Plan:  Patient was evaluated and treated and all questions answered.  S/p foot surgery right -Progressing as expected post-operatively. -XR: Noted above no complication -WB Status: WB to heel in cam walker boot -Sutures: Return in 2 weeks to remove. -Medications: No refills required -Foot redressed.  No follow-ups on file.

## 2023-08-31 ENCOUNTER — Encounter: Payer: Self-pay | Admitting: Podiatry

## 2023-08-31 ENCOUNTER — Ambulatory Visit (INDEPENDENT_AMBULATORY_CARE_PROVIDER_SITE_OTHER): Payer: Medicare Other | Admitting: Podiatry

## 2023-08-31 DIAGNOSIS — M2011 Hallux valgus (acquired), right foot: Secondary | ICD-10-CM

## 2023-08-31 NOTE — Progress Notes (Signed)
  Subjective:  Patient ID: Ernest Walker, male    DOB: [birth year],  MRN: [id number]  Chief Complaint  Patient presents with   Routine Post Op    PATIENT STATES THAT HIS FEET HAVE BEEN SOME WHAT NORMAL SOMETIMES IT STINGS BUT NOTHING UNBEARABLE . PATIENT HAS MEDICATION FOR PAIN BUT ONLY TOOK ABOUT 3 SINCE SURGERY.     DOS: 08/07/2023 Procedure: Extensor tenotomy soft tissue capsulotomy and rebalancing of the right first MTP  77 y.o. male returns for post-op check.   Review of Systems: Negative except as noted in the HPI. Denies N/V/F/Ch.   Objective:  There were no vitals filed for this visit. There is no height or weight on file to calculate BMI. Constitutional Well developed. Well nourished.  Vascular Foot warm and well perfused. Capillary refill normal to all digits.  Calf is soft and supple, no posterior calf or knee pain, negative Homans' sign  Neurologic Normal speech. Oriented to person, place, and time. Epicritic sensation to light touch grossly present bilaterally.  Dermatologic Skin healing well without signs of infection. Skin edges well coapted without signs of infection.  Orthopedic: Hallux maintained in rectus position no complication of pin sites.  He has no pain to palpation noted about the surgical site.   Multiple view plain film radiographs: Good correction noted with Kirschner wires intact and good positioning Assessment:   1. Hallux valgus (acquired), right foot    Plan:  Patient was evaluated and treated and all questions answered.  S/p foot surgery right -Doing well sutures removed uneventfully today.  He may continue weightbearing in the cam walker boot.  I will see him back in 3 weeks to remove the Kirschner wires.  He may wash his foot and should apply Neosporin or Vaseline around the pin sites daily when bathing  No follow-ups on file.

## 2023-09-19 ENCOUNTER — Ambulatory Visit (INDEPENDENT_AMBULATORY_CARE_PROVIDER_SITE_OTHER): Payer: No Typology Code available for payment source

## 2023-09-19 ENCOUNTER — Ambulatory Visit (INDEPENDENT_AMBULATORY_CARE_PROVIDER_SITE_OTHER): Payer: Medicare Other | Admitting: Podiatry

## 2023-09-19 ENCOUNTER — Encounter: Payer: Self-pay | Admitting: Podiatry

## 2023-09-19 DIAGNOSIS — Z9889 Other specified postprocedural states: Secondary | ICD-10-CM

## 2023-09-19 DIAGNOSIS — M2011 Hallux valgus (acquired), right foot: Secondary | ICD-10-CM

## 2023-09-19 NOTE — Progress Notes (Signed)
  Subjective:  Patient ID: Ernest Walker, male    DOB: 11-Nov-1945,  MRN: [id number]  Chief Complaint  Patient presents with   Routine Post Op    POV # 3 DOS 08/07/2023 ---TENDON AND LIGAMENT RELEASE WIHT PINNING OF GREAT TOE RIGHT   "Its been okay, just really tired of this boot"    DOS: 08/07/2023 Procedure: Extensor tenotomy soft tissue capsulotomy and rebalancing of the right first MTP  77 y.o. male returns for post-op check.   Review of Systems: Negative except as noted in the HPI. Denies N/V/F/Ch.   Objective:  There were no vitals filed for this visit. There is no height or weight on file to calculate BMI. Constitutional Well developed. Well nourished.  Vascular Foot warm and well perfused. Capillary refill normal to all digits.  Calf is soft and supple, no posterior calf or knee pain, negative Homans' sign  Neurologic Normal speech. Oriented to person, place, and time. Epicritic sensation to light touch grossly present bilaterally.  Dermatologic Skin healing well without signs of infection. Skin edges well coapted without signs of infection.  Orthopedic: Hallux maintained in rectus position no complication of pin sites.  He has no pain to palpation noted about the surgical site.   Multiple view plain film radiographs: Correction is maintained with no complication of hardware Assessment:   1. Hallux valgus (acquired), right foot   2. Status post right foot surgery    Plan:  Patient was evaluated and treated and all questions answered.  S/p foot surgery right Kirschner wire removed uneventfully he may return to regular shoe gear at this point.  Bandages applied over pin sites.  Resume regular bathing tomorrow.  Return in 6 weeks to reevaluate hopefully maintains his correction at this point.  Return in about 6 weeks (around 10/31/2023) for post op (new x-rays).

## 2023-10-03 ENCOUNTER — Ambulatory Visit (INDEPENDENT_AMBULATORY_CARE_PROVIDER_SITE_OTHER): Payer: Medicare Other | Admitting: Podiatry

## 2023-10-03 DIAGNOSIS — Z91199 Patient's noncompliance with other medical treatment and regimen due to unspecified reason: Secondary | ICD-10-CM

## 2023-10-03 NOTE — Progress Notes (Signed)
1. No-show for appointment    Patient is under care of Dr. Lilian Kapur s/p surgery.

## 2023-10-31 ENCOUNTER — Ambulatory Visit (INDEPENDENT_AMBULATORY_CARE_PROVIDER_SITE_OTHER): Payer: No Typology Code available for payment source | Admitting: Podiatry

## 2023-10-31 ENCOUNTER — Ambulatory Visit (INDEPENDENT_AMBULATORY_CARE_PROVIDER_SITE_OTHER): Payer: No Typology Code available for payment source

## 2023-10-31 ENCOUNTER — Encounter: Payer: Self-pay | Admitting: Podiatry

## 2023-10-31 DIAGNOSIS — M2011 Hallux valgus (acquired), right foot: Secondary | ICD-10-CM

## 2023-10-31 NOTE — Progress Notes (Signed)
  Subjective:  Patient ID: Ernest Walker, male    DOB: [birth year],  MRN: [id number]  Chief Complaint  Patient presents with   Routine Post Op    He reports he has some throbbing once in a while but no pain .    DOS: 08/07/2023 Procedure: Extensor tenotomy soft tissue capsulotomy and rebalancing of the right first MTP  78 y.o. male returns for post-op check.   Review of Systems: Negative except as noted in the HPI. Denies N/V/F/Ch.   Objective:  There were no vitals filed for this visit. There is no height or weight on file to calculate BMI. Constitutional Well developed. Well nourished.  Vascular Foot warm and well perfused. Capillary refill normal to all digits.  Calf is soft and supple, no posterior calf or knee pain, negative Homans' sign  Neurologic Normal speech. Oriented to person, place, and time. Epicritic sensation to light touch grossly present bilaterally.  Dermatologic Skin is well healed  Orthopedic: Hallux maintained in rectus position no complication of pin sites.  He has no pain to palpation noted about the surgical site.  He does have bilateral pitting edema worse on the right side   Multiple view plain film radiographs: Correction is maintained with little recurrence and no bony abnormality changes since last radiographs Assessment:   1. Hallux valgus (acquired), right foot    Plan:  Patient was evaluated and treated and all questions answered.  S/p foot surgery right Doing very well may continue regular shoe gear and activity follow-up with me as needed he does have some chronic swelling in both lower extremities worse on his surgical side  Return if symptoms worsen or fail to improve.

## 2024-09-03 ENCOUNTER — Inpatient Hospital Stay (HOSPITAL_COMMUNITY)
Admission: EM | Admit: 2024-09-03 | Discharge: 2024-09-07 | DRG: 640 | Disposition: A | Discharge status: Home Health | Attending: Family Medicine | Admitting: Family Medicine

## 2024-09-03 ENCOUNTER — Inpatient Hospital Stay (HOSPITAL_COMMUNITY)

## 2024-09-03 ENCOUNTER — Other Ambulatory Visit: Payer: Self-pay

## 2024-09-03 ENCOUNTER — Encounter (HOSPITAL_COMMUNITY): Payer: Self-pay

## 2024-09-03 ENCOUNTER — Emergency Department (HOSPITAL_COMMUNITY)

## 2024-09-03 DIAGNOSIS — Z7984 Long term (current) use of oral hypoglycemic drugs: Secondary | ICD-10-CM | POA: Diagnosis not present

## 2024-09-03 DIAGNOSIS — N289 Disorder of kidney and ureter, unspecified: Secondary | ICD-10-CM

## 2024-09-03 DIAGNOSIS — Z87891 Personal history of nicotine dependence: Secondary | ICD-10-CM | POA: Diagnosis not present

## 2024-09-03 DIAGNOSIS — Z923 Personal history of irradiation: Secondary | ICD-10-CM

## 2024-09-03 DIAGNOSIS — K219 Gastro-esophageal reflux disease without esophagitis: Secondary | ICD-10-CM | POA: Diagnosis present

## 2024-09-03 DIAGNOSIS — E1122 Type 2 diabetes mellitus with diabetic chronic kidney disease: Secondary | ICD-10-CM | POA: Diagnosis present

## 2024-09-03 DIAGNOSIS — J90 Pleural effusion, not elsewhere classified: Secondary | ICD-10-CM | POA: Diagnosis present

## 2024-09-03 DIAGNOSIS — K649 Unspecified hemorrhoids: Secondary | ICD-10-CM | POA: Diagnosis present

## 2024-09-03 DIAGNOSIS — R918 Other nonspecific abnormal finding of lung field: Secondary | ICD-10-CM | POA: Diagnosis not present

## 2024-09-03 DIAGNOSIS — Z862 Personal history of diseases of the blood and blood-forming organs and certain disorders involving the immune mechanism: Secondary | ICD-10-CM

## 2024-09-03 DIAGNOSIS — N179 Acute kidney failure, unspecified: Secondary | ICD-10-CM | POA: Diagnosis present

## 2024-09-03 DIAGNOSIS — R0609 Other forms of dyspnea: Secondary | ICD-10-CM | POA: Diagnosis not present

## 2024-09-03 DIAGNOSIS — I5032 Chronic diastolic (congestive) heart failure: Secondary | ICD-10-CM | POA: Diagnosis present

## 2024-09-03 DIAGNOSIS — Z79899 Other long term (current) drug therapy: Secondary | ICD-10-CM

## 2024-09-03 DIAGNOSIS — E785 Hyperlipidemia, unspecified: Secondary | ICD-10-CM | POA: Diagnosis present

## 2024-09-03 DIAGNOSIS — Z1152 Encounter for screening for COVID-19: Secondary | ICD-10-CM

## 2024-09-03 DIAGNOSIS — N189 Chronic kidney disease, unspecified: Secondary | ICD-10-CM

## 2024-09-03 DIAGNOSIS — E861 Hypovolemia: Secondary | ICD-10-CM | POA: Diagnosis present

## 2024-09-03 DIAGNOSIS — Z992 Dependence on renal dialysis: Secondary | ICD-10-CM | POA: Diagnosis not present

## 2024-09-03 DIAGNOSIS — I2489 Other forms of acute ischemic heart disease: Secondary | ICD-10-CM | POA: Diagnosis present

## 2024-09-03 DIAGNOSIS — Z8546 Personal history of malignant neoplasm of prostate: Secondary | ICD-10-CM

## 2024-09-03 DIAGNOSIS — F4310 Post-traumatic stress disorder, unspecified: Secondary | ICD-10-CM | POA: Diagnosis present

## 2024-09-03 DIAGNOSIS — I5030 Unspecified diastolic (congestive) heart failure: Secondary | ICD-10-CM | POA: Diagnosis present

## 2024-09-03 DIAGNOSIS — E119 Type 2 diabetes mellitus without complications: Secondary | ICD-10-CM

## 2024-09-03 DIAGNOSIS — Z7982 Long term (current) use of aspirin: Secondary | ICD-10-CM

## 2024-09-03 DIAGNOSIS — R0602 Shortness of breath: Principal | ICD-10-CM

## 2024-09-03 DIAGNOSIS — Z9049 Acquired absence of other specified parts of digestive tract: Secondary | ICD-10-CM

## 2024-09-03 DIAGNOSIS — E041 Nontoxic single thyroid nodule: Secondary | ICD-10-CM | POA: Diagnosis present

## 2024-09-03 DIAGNOSIS — Z91048 Other nonmedicinal substance allergy status: Secondary | ICD-10-CM

## 2024-09-03 DIAGNOSIS — E8770 Fluid overload, unspecified: Secondary | ICD-10-CM | POA: Diagnosis present

## 2024-09-03 DIAGNOSIS — Z794 Long term (current) use of insulin: Secondary | ICD-10-CM | POA: Diagnosis not present

## 2024-09-03 DIAGNOSIS — Z888 Allergy status to other drugs, medicaments and biological substances status: Secondary | ICD-10-CM

## 2024-09-03 DIAGNOSIS — N186 End stage renal disease: Secondary | ICD-10-CM | POA: Diagnosis present

## 2024-09-03 DIAGNOSIS — D631 Anemia in chronic kidney disease: Secondary | ICD-10-CM | POA: Diagnosis present

## 2024-09-03 DIAGNOSIS — I132 Hypertensive heart and chronic kidney disease with heart failure and with stage 5 chronic kidney disease, or end stage renal disease: Principal | ICD-10-CM | POA: Diagnosis present

## 2024-09-03 DIAGNOSIS — R911 Solitary pulmonary nodule: Secondary | ICD-10-CM | POA: Diagnosis present

## 2024-09-03 DIAGNOSIS — I10 Essential (primary) hypertension: Secondary | ICD-10-CM | POA: Insufficient documentation

## 2024-09-03 DIAGNOSIS — Z789 Other specified health status: Secondary | ICD-10-CM | POA: Insufficient documentation

## 2024-09-03 LAB — BASIC METABOLIC PANEL WITH GFR
Anion gap: 14 (ref 5–15)
BUN: 60 mg/dL — ABNORMAL HIGH (ref 8–23)
CO2: 16 mmol/L — ABNORMAL LOW (ref 22–32)
Calcium: 8.3 mg/dL — ABNORMAL LOW (ref 8.9–10.3)
Chloride: 109 mmol/L (ref 98–111)
Creatinine, Ser: 7.58 mg/dL — ABNORMAL HIGH (ref 0.61–1.24)
GFR, Estimated: 7 mL/min — ABNORMAL LOW (ref >60)
Glucose, Bld: 102 mg/dL — ABNORMAL HIGH (ref 70–99)
Potassium: 5.1 mmol/L (ref 3.5–5.1)
Sodium: 139 mmol/L (ref 135–145)

## 2024-09-03 LAB — TROPONIN I (HIGH SENSITIVITY)
Troponin I (High Sensitivity): 56 ng/L — ABNORMAL HIGH (ref <18)
Troponin I (High Sensitivity): 61 ng/L — ABNORMAL HIGH (ref <18)

## 2024-09-03 LAB — RENAL FUNCTION PANEL
Albumin: 2.3 g/dL — ABNORMAL LOW (ref 3.5–5.0)
Anion gap: 12 (ref 5–15)
BUN: 37 mg/dL — ABNORMAL HIGH (ref 8–23)
CO2: 22 mmol/L (ref 22–32)
Calcium: 8.1 mg/dL — ABNORMAL LOW (ref 8.9–10.3)
Chloride: 107 mmol/L (ref 98–111)
Creatinine, Ser: 5.55 mg/dL — ABNORMAL HIGH (ref 0.61–1.24)
GFR, Estimated: 10 mL/min — ABNORMAL LOW (ref >60)
Glucose, Bld: 111 mg/dL — ABNORMAL HIGH (ref 70–99)
Phosphorus: 4 mg/dL (ref 2.5–4.6)
Potassium: 4.3 mmol/L (ref 3.5–5.1)
Sodium: 141 mmol/L (ref 135–145)

## 2024-09-03 LAB — RESP PANEL BY RT-PCR (RSV, FLU A&B, COVID)  RVPGX2
Influenza A by PCR: NEGATIVE
Influenza B by PCR: NEGATIVE
Resp Syncytial Virus by PCR: NEGATIVE
SARS Coronavirus 2 by RT PCR: NEGATIVE

## 2024-09-03 LAB — ECHOCARDIOGRAM COMPLETE
Est EF: 50
Height: 69 in
S' Lateral: 3.3 cm
Weight: 2880 [oz_av]

## 2024-09-03 LAB — HEPATIC FUNCTION PANEL
ALT: 9 U/L (ref 0–44)
AST: 15 U/L (ref 15–41)
Albumin: 2.4 g/dL — ABNORMAL LOW (ref 3.5–5.0)
Alkaline Phosphatase: 47 U/L (ref 38–126)
Bilirubin, Direct: 0.1 mg/dL (ref 0.0–0.2)
Indirect Bilirubin: 0.4 mg/dL (ref 0.3–0.9)
Total Bilirubin: 0.5 mg/dL (ref 0.0–1.2)
Total Protein: 6.7 g/dL (ref 6.5–8.1)

## 2024-09-03 LAB — CBC
HCT: 28 % — ABNORMAL LOW (ref 39.0–52.0)
HCT: 27.7 % — ABNORMAL LOW (ref 39.0–52.0)
Hemoglobin: 8.5 g/dL — ABNORMAL LOW (ref 13.0–17.0)
Hemoglobin: 8.8 g/dL — ABNORMAL LOW (ref 13.0–17.0)
MCH: 25.8 pg — ABNORMAL LOW (ref 26.0–34.0)
MCH: 26 pg (ref 26.0–34.0)
MCHC: 30.4 g/dL (ref 30.0–36.0)
MCHC: 31.8 g/dL (ref 30.0–36.0)
MCV: 84.8 fL (ref 80.0–100.0)
MCV: 81.7 fL (ref 80.0–100.0)
Platelets: 237 K/uL (ref 150–400)
Platelets: 231 K/uL (ref 150–400)
RBC: 3.3 MIL/uL — ABNORMAL LOW (ref 4.22–5.81)
RBC: 3.39 MIL/uL — ABNORMAL LOW (ref 4.22–5.81)
RDW: 16.5 % — ABNORMAL HIGH (ref 11.5–15.5)
RDW: 16.2 % — ABNORMAL HIGH (ref 11.5–15.5)
WBC: 7.8 K/uL (ref 4.0–10.5)
WBC: 8.4 K/uL (ref 4.0–10.5)
nRBC: 0 % (ref 0.0–0.2)
nRBC: 0 % (ref 0.0–0.2)

## 2024-09-03 LAB — TSH: TSH: 2.268 u[IU]/mL (ref 0.350–4.500)

## 2024-09-03 LAB — BRAIN NATRIURETIC PEPTIDE: B Natriuretic Peptide: 336.7 pg/mL — ABNORMAL HIGH (ref 0.0–100.0)

## 2024-09-03 LAB — CBG MONITORING, ED: Glucose-Capillary: 86 mg/dL (ref 70–99)

## 2024-09-03 LAB — HEPATITIS B SURFACE ANTIGEN: Hepatitis B Surface Ag: NONREACTIVE

## 2024-09-03 MED ORDER — PENTAFLUOROPROP-TETRAFLUOROETH EX AERO
1.0000 | INHALATION_SPRAY | CUTANEOUS | Status: DC | PRN
Start: 2024-09-03 — End: 2024-09-04

## 2024-09-03 MED ORDER — ACETAMINOPHEN 500 MG PO TABS
1000.0000 mg | ORAL_TABLET | Freq: Four times a day (QID) | ORAL | Status: DC | PRN
  Administered 2024-09-03: 1000 mg via ORAL
  Filled 2024-09-03: qty 2

## 2024-09-03 MED ORDER — HEPARIN SODIUM (PORCINE) 1000 UNIT/ML DIALYSIS: 1000.0000 [IU] | INTRAMUSCULAR | Status: DC | PRN

## 2024-09-03 MED ORDER — LIDOCAINE-PRILOCAINE 2.5-2.5 % EX CREA
1.0000 | TOPICAL_CREAM | CUTANEOUS | Status: DC | PRN
Start: 2024-09-03 — End: 2024-09-04

## 2024-09-03 MED ORDER — ALTEPLASE 2 MG IJ SOLR
2.0000 mg | Freq: Once | INTRAMUSCULAR | Status: DC | PRN
Start: 2024-09-03 — End: 2024-09-04

## 2024-09-03 MED ORDER — HYDROCORTISONE 1 % EX CREA
TOPICAL_CREAM | Freq: Two times a day (BID) | CUTANEOUS | Status: DC
  Administered 2024-09-03: 1 via TOPICAL
  Filled 2024-09-03: qty 28

## 2024-09-03 MED ORDER — HEPARIN SODIUM (PORCINE) 5000 UNIT/ML IJ SOLN
5000.0000 [IU] | Freq: Three times a day (TID) | INTRAMUSCULAR | Status: DC
  Administered 2024-09-03 – 2024-09-05 (×5): 5000 [IU] via SUBCUTANEOUS
  Filled 2024-09-03 (×5): qty 1

## 2024-09-03 MED ORDER — LIDOCAINE HCL (PF) 1 % IJ SOLN: 5.0000 mL | INTRAMUSCULAR | Status: DC | PRN

## 2024-09-03 MED ORDER — ANTICOAGULANT SODIUM CITRATE 4% (200MG/5ML) IV SOLN: 5.0000 mL | Status: DC | PRN

## 2024-09-03 MED ORDER — CHLORHEXIDINE GLUCONATE CLOTH 2 % EX PADS
6.0000 | MEDICATED_PAD | Freq: Every day | CUTANEOUS | Status: DC
  Administered 2024-09-04 – 2024-09-07 (×3): 6 via TOPICAL

## 2024-09-03 MED ORDER — PENTAFLUOROPROP-TETRAFLUOROETH EX AERO
INHALATION_SPRAY | CUTANEOUS | Status: AC
  Filled 2024-09-03: qty 30

## 2024-09-03 MED ORDER — HEPARIN SODIUM (PORCINE) 1000 UNIT/ML IJ SOLN
INTRAMUSCULAR | Status: AC
  Filled 2024-09-03: qty 4

## 2024-09-03 MED ORDER — INSULIN ASPART 100 UNIT/ML IJ SOLN: 0.0000 [IU] | Freq: Three times a day (TID) | INTRAMUSCULAR | Status: DC

## 2024-09-03 MED ORDER — FUROSEMIDE 10 MG/ML IJ SOLN: 40.0000 mg | Freq: Once | INTRAMUSCULAR | Status: DC

## 2024-09-03 MED ORDER — FUROSEMIDE 10 MG/ML IJ SOLN
40.0000 mg | Freq: Once | INTRAMUSCULAR | Status: DC
  Filled 2024-09-03: qty 4

## 2024-09-03 MED ORDER — SODIUM CHLORIDE 0.9% FLUSH
3.0000 mL | Freq: Two times a day (BID) | INTRAVENOUS | Status: DC
  Administered 2024-09-03 – 2024-09-07 (×8): 3 mL via INTRAVENOUS

## 2024-09-03 NOTE — Plan of Care (Cosign Needed)
 FMTS Brief Progress Note  S:Ernest Walker is an 78 Y/O M with PMHx of DM2, HTN, CKD 4, Prostate Ca, presents with SOB , CXR found to have pleural effusion. He is tolerating iHD very well. Currently on RA. Off note patient c/o severe pain from hemorrhoid from pre admission.     O: BP (!) 152/72   Pulse 91   Temp (!) 97.4 F (36.3 C) (Oral)   Resp (!) 25   Ht 5' 9 (1.753 m)   Wt 81.6 kg   SpO2 100%   BMI 26.58 kg/m    Exam: Gen: Non toxic  HEENT: + JVD Neuro: Grossly intact Heart: S1 S2 normal, no murmurs. RRR Lungs: Rhonchi RLL diminish LLL Abd: soft, no tenderness Ext: bilateral 2+ pitting edema LUE fistula   A/P: Assessment & Plan Hypervolemia associated with renal insufficiency Pleural effusion, R>L SOB (shortness of breath) Elevated BNP, JVD, pleural effusions on CXR.  Currently on RA - Vitals per unit routine - Consider Lasix for diuresis - TTE show LV EF 50%. Low normal fx - AM CXR - AM RFP, mag - Follow-up nephrology recommendations  CKD (chronic kidney disease) - Nephrology consulted and followed - iHD per Nephrology recommendation  Hypertension sBP 170 during iHD but asymptomatic.  - Will continue to follow - Continue home meds Hemorrhoid State severe rectal pain - Pain regimen : Tylenol  1000 mg Q6H PRN - Hydrocortisone cream 1% BID - No NSAID d/t ERSD   Yamilett Anastos B, DO 09/03/2024, 8:05 PM PGY-1, [facility] Night Resident  Please page [phone] with questions.

## 2024-09-03 NOTE — Consult Note (Signed)
 [facility] Nephrology Consultation Note  Requesting MD: Dr. Anders Cumins Reason for consult: New ESRD, SOB/LE edema.  HPI:  Ernest Walker is a 78 y.o. 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 The patient has progressive CKD 5 and Followed by [name] at [facility] and also at [facility].  AV fistula was created on 11/20/2023 and seems to have matured well.  He was suggested to start dialysis before however he wanted to exhaust all medical care before starting dialysis.  He was on IV iron for the management of anemia.  Also on calcitriol, sodium bicarbonate at home. The patient has worsening shortness of breath, dyspnea exertion and lower extremity edema.  He also reports feeling cold. In the ER his blood pressure was elevated to 163/63, in the room air.  The labs showed potassium level 5.1, CO2 16, BUN 60, creatinine level 7.5.,  BNP 336, hemoglobin 8.5.  Corrected calcium  level acceptable.  Chest x-ray with patchy airspace disease in the left side unable to rule out pneumonia.  Also has bilateral pleural effusion and evidence of fluid overload.  Ordered a dose of Lasix and patient is being admitted for further evaluation. He was accompanied by his wife at the bedside.  The patient said his symptoms are gradually worsening.  He also has some nausea, no vomiting.  No chest pain.  PMHx:   Past Medical History:  Diagnosis Date   Arthritis    hands, knees   Carpal tunnel syndrome of right wrist    Diabetes mellitus without complication (HCC)    Hypertension    Prostate cancer (HCC) 2007   radiation implant done at [facility] in [facility]    Past Surgical History:   Procedure Laterality Date   CARPAL TUNNEL RELEASE Right 08/18/2015   Procedure: RIGHT CARPAL TUNNEL RELEASE;  Surgeon: Arley Curia, MD;  Location: [facility];  Service: Orthopedics;  Laterality: Right;  block in preop   INCISION AND DRAINAGE DEEP NECK ABSCESS     INSERTION PROSTATE RADIATION SEED  2007   at [place] in [facility] NERVE TRANSPOSITION Right 08/18/2015   Procedure:  DECOMPRESSION ULNAR NERVE RIGHT WRIST WITH DEBRIDEMENT ULNAR NERVE;  Surgeon: Arley Curia, MD;  Location: [facility];  Service: Orthopedics;  Laterality: Right;  block in preop    Family Hx:  Family History  Family history unknown: Yes    Social History:  reports that he has quit smoking. He has never used smokeless tobacco. He reports that he does not drink alcohol and does not use drugs.  Allergies:  Allergies  Allergen Reactions   Nsaids Other (See Comments)    Contraindication due to CKD     Medications: Prior to Admission medications   Medication Sig Start Date End Date Taking? Authorizing Provider  amLODipine  (NORVASC ) 10 MG tablet Take 10 mg by mouth daily. 04/12/22  Yes [provider]  atorvastatin  (LIPITOR) 80 MG tablet TAKE ONE-HALF TABLET BY MOUTH AT BEDTIME FOR CHOLESTEROL 10/12/21  Yes [provider]  insulin  glargine-yfgn (SEMGLEE) 100 UNIT/ML Pen Inject 10 Units into the skin daily. 10/07/21  Yes [provider]  lisinopril (ZESTRIL) 40 MG tablet TAKE ONE-HALF TABLET BY MOUTH EVERY  DAY FOR BLOOD PRESSURE 04/12/22  Yes [provider]  terazosin  (HYTRIN ) 10 MG capsule Take 1 capsule by mouth at bedtime. 04/12/22  Yes [provider]  aspirin 81 MG chewable tablet CHEW ONE TABLET BY MOUTH EVERY DAY ASK YOUR DOCTOR HOW LONG YOU SHOULD TAKE THIS MEDICATION Patient not taking: Reported on 09/03/2024 04/12/22   [provider]  calcitRIOL (ROCALTROL) 0.25 MCG capsule Take 1 capsule by mouth 2 (two) times daily. Patient  not taking: Reported on 09/03/2024 04/12/22   [provider]  dorzolamide-timolol (COSOPT) 2-0.5 % ophthalmic solution Apply 1 drop to eye 2 (two) times daily. Patient not taking: Reported on 09/03/2024 05/06/24   [provider]  empagliflozin (JARDIANCE) 25 MG TABS tablet Take 0.5 tablets by mouth daily. Patient not taking: Reported on 09/03/2024 09/29/20   [provider]  ferrous sulfate 325 (65 FE) MG tablet Take 325 mg by mouth See admin instructions. Take one tablet on Monday, Wednesday, and Friday Patient not taking: Reported on 09/03/2024 11/09/20   [provider]  sevelamer carbonate (RENVELA) 800 MG tablet Take 800 mg by mouth 3 (three) times daily with meals. Patient not taking: Reported on 09/03/2024 07/05/24   [provider]  sodium bicarbonate 650 MG tablet Take 1,300 mg by mouth 2 (two) times daily. Patient not taking: Reported on 09/03/2024 07/05/24   [provider]    I have reviewed the patient's current medications.  Labs: Renal Panel: Recent Labs  Lab 09/03/24 0733  NA 139  K 5.1  CL 109  CO2 16*  GLUCOSE 102*  BUN 60*  CREATININE 7.58*  CALCIUM  8.3*     CBC:    Latest Ref Rng & Units 09/03/2024    7:33 AM 01/06/2023   12:55 PM 12/05/2020    6:46 AM  CBC  WBC 4.0 - 10.5 K/uL 7.8  8.5  9.3   Hemoglobin 13.0 - 17.0 g/dL 8.5  8.4  9.6   Hematocrit 39.0 - 52.0 % 28.0  28.8  31.7   Platelets 150 - 400 K/uL 237  350  293      Anemia Panel:  Recent Labs    09/03/24 0733  HGB 8.5*  MCV 84.8    Recent Labs  Lab 09/03/24 0733  AST 15  ALT 9  ALKPHOS 47  BILITOT 0.5  PROT 6.7  ALBUMIN 2.4*    Lab Results  Component Value Date   HGBA1C 7.7 (H) 11/29/2020    ROS:  Pertinent items noted in HPI and remainder of comprehensive ROS otherwise negative.  Physical Exam: Vitals:   09/03/24 1030 09/03/24 1100  BP: (!) 159/67 (!) 148/64  Pulse: 88 85  Resp: (!) 21 20  Temp:    SpO2: 97% 99%      General exam: Appears calm and comfortable  Respiratory system: Bibasilar decreased breath sound, no wheeze. Cardiovascular system: S1 & S2 heard, RRR.  Bilateral lower extremities pitting edema present++ Gastrointestinal system: Abdomen is nondistended, soft and nontender. Normal bowel sounds heard. Central nervous system: Alert and oriented. No focal neurological deficits. Extremities: Edema present, no cyanosis. Skin: No rashes, lesions or ulcers Psychiatry: Alert awake.  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  Followed by [name] at [facility] and at the [facility]  He has LUE AV fistula created on 11/20/2023. - Plan to start dialysis today using AV fistula.  Patient and his wife agreed with the plan.  We  will cannulate with the smallest needle.  I have explained to the patient that he may need HD catheter if there is a problem with the cannulation.  I have contacted renal navigator to arrange outpatient dialysis.  He will need serial HD treatment.  # Fluid overload/lower extreme edema: Plan to UF with dialysis.  Getting a dose of IV Lasix while waiting to start dialysis.  Recommend fluid restriction.  # Hypertension: Managing volume with dialysis, holding antihypertensives.  # Anemia of CKD: Patient used to get iron infusion as outpatient.  We will check iron level and consider erythropoietin treatment.  # CKD-MBD: Check PTH level.  Monitor calcium  and phosphorus level.  Discussed with the primary team and patient's wife. Thank you for the consult, we will continue to follow with you.  Amatullah Christy Amelie Romney 09/03/2024, 1:53 PM  [employer].

## 2024-09-03 NOTE — Progress Notes (Signed)
 Echocardiogram Attempted exam at 1115am. Patient not in room.  Juliene JINNY Rucks 09/03/2024, 11:13 AM

## 2024-09-03 NOTE — Progress Notes (Signed)
 Received patient in on stretcher to unit.  Alert and oriented.  Informed consent signed and in chart.   TX duration: 2  Patient tolerated well.  Transported back to the room  Alert, without acute distress.  Hand-off given to patient's nurse Shift Charge   Access used: AVF Access issues: None  Total UF removed: 500 Medication(s) given: None Post HD VS: T99.1-HR 93-RR-21 B/p160/88 Post HD weight: 81.1kg  Neville Seip, RN [facility] D[facility]   09/03/24 2000  Vitals  Temp 99.1 F (37.3 C)  Temp Source Oral  BP (!) 160/68  MAP (mmHg) 95  BP Location Right Arm  BP Method Automatic  Patient Position (if appropriate) Lying  Pulse Rate 91  Pulse Rate Source Monitor  ECG Heart Rate 93  Resp (!) 21  Weight 81.1 kg  Type of Weight Post-Dialysis  Oxygen Therapy  SpO2 96 %  O2 Device Room Air  Patient Activity (if Appropriate) In bed  Pulse Oximetry Type Continuous  During Treatment Monitoring  Blood Flow Rate (mL/min) 0 mL/min  Arterial Pressure (mmHg) 7.88 mmHg  Venous Pressure (mmHg) -499.98 mmHg  TMP (mmHg) 20.2 mmHg  Ultrafiltration Rate (mL/min) 399 mL/min  Dialysate Flow Rate (mL/min) 299 ml/min  Dialysate Potassium Concentration 2K  Dialysate Calcium  Concentration 2.5  Duration of HD Treatment -hour(s) 2 hour(s)  Cumulative Fluid Removed (mL) per Treatment  500.07  HD Safety Checks Performed Yes  Intra-Hemodialysis Comments Tx completed;Tolerated well  Post Treatment  Dialyzer Clearance Clear  Liters Processed 30  Fluid Removed (mL) 500 mL  Tolerated HD Treatment Yes  AVG/AVF Arterial Site Held (minutes) 10 minutes  AVG/AVF Venous Site Held (minutes) 10 minutes

## 2024-09-03 NOTE — Progress Notes (Signed)
 Contacted by nephrologist regarding need for out-pt HD. Navigator will begin CLIP at this time and update as available.    Ernest Walker Dialysis Nav [phone]

## 2024-09-03 NOTE — Progress Notes (Signed)
 Education printed and given to patient on: CKD, eating plan and what to know.

## 2024-09-03 NOTE — Hospital Course (Addendum)
 RAFEL GARDE is a 78 y.o. year old with a history of T2DM, ESRD (new this admission; known CKD), HTN, HLD who presented with dyspnea and edema and was admitted to the [facility] Medicine Teaching Service for hypervolemia associated with renal insufficiency; also found to have pleural effusions.  Hypervolemia associated with renal insufficiency ESRD HFpEF History of chronic kidney disease with fistula placement 10/2023.  Patient presented with 3 weeks of worsening kidney function and volume overload.  Got echo to exclude CHF exacerbation, which showed LVEF 50% with Grade II diastolic dysfunction.  Nephrology was consulted, and recommended starting patient on dialysis, which was initiated 09/03/24.  Was set up for outpatient dialysis, to start Monday 11/17 with MWF schedule.  Lung nodule CT Chest 11/13 with finding of 7.1 x 5.3 cm masslike mixed density lesion in the parahilar left upper lobe concerning for possible adenocarcinoma.  Pulmonology consulted, who was concerned for malignancy vs possible infection. Patient was started on 10 day course of Augmentin  (11/13-11/22). Recommendation per pulm for repeat CT scan in ~1 month with subsequent outpatient pulm appointment.  Pleural effusions Chest x-ray 11/11 with large right sided pleural effusion and smaller left pleural effusion.  Suspect secondary to volume overload.  Repeat chest x-ray done 11/12 showed no significant change. CT with contrast was obtained 11/12 (nephro gave okay for contrast) which showed moderate to large right pleural effusion in addition to lung nodule as above. Pulm performed thoracentesis on 11/14 with 1000 cc's of millky pink-red tinged fluid drained. Post-procedure CXR with decreased size of right pleural effusion. Pleural fluid with few WBC, no organisms at time of discharge, transudate by Lights criteria.  Thyroid nodule 2.1 cm right thyroid nodule seen on CT chest 11/13. TSH of 2.268.  Thyroid ultrasound ordered, which showed  bilateral thyroid cysts not requiring further workup or follow-up.  Other chronic conditions were medically managed with home medications and formulary alternatives as necessary (anemia, HTN, T2DM, hemorrhoid, HLD).  PCP Follow-up Recommendations: Follow-up hemorrhoid (started on hydrocortisone cream inpatient; decide length of time for this) Ensure compliance with dialysis. Ensure follow-up with pulm in mid-December; patient needs repeat CT chest with contrast prior to this visit Hep B antibody low on lab; consider Hep B vaccine Assess need for ASA

## 2024-09-03 NOTE — Assessment & Plan Note (Signed)
 Stable hemoglobin from 1 year ago.  Reportedly has received 6 doses of IV iron, most recently completed course on 10/29 - AM CBC

## 2024-09-03 NOTE — Progress Notes (Signed)
 Patient arrived to room 3E04 from dialysis.  Assessment complete, VS obtained, and Admission database began.

## 2024-09-03 NOTE — Assessment & Plan Note (Signed)
 Elevated BNP, JVD, pleural effusions on CXR.  Currently on RA - Vitals per unit routine - Consider Lasix for diuresis - TTE show LV EF 50%. Low normal fx - AM CXR - AM RFP, mag - Follow-up nephrology recommendations

## 2024-09-03 NOTE — Assessment & Plan Note (Signed)
 sBP 170 during iHD but asymptomatic.  - Will continue to follow - Continue home meds

## 2024-09-03 NOTE — ED Provider Notes (Signed)
 [place] EMERGENCY DEPARTMENT AT [facility] Provider Note   CSN: [id number] Arrival date & time: 09/03/24  9288     Patient presents with: Shortness of Breath   Ernest Walker is a 78 y.o. male.   Patient here with shortness of breath leg swelling for the last few weeks.  Worse with ambulation.  He has history of chronic kidney disease.  He has a fistula in place but has not started dialysis.  He denies any cough fever chills.  He has history of hypertension diabetes prostate cancer in remission.  He denies any pain with urination.  He denies any recent surgery or travel.  He is not having any chest pain.  He is having ongoing hemorrhoid pain as well.  The history is provided by the patient.       Prior to Admission medications   Medication Sig Start Date End Date Taking? Authorizing Provider  amLODipine  (NORVASC ) 10 MG tablet TAKE ONE-HALF TABLET BY MOUTH EVERY DAY FOR BLOOD PRESSURE 04/12/22   [provider]  aspirin 81 MG chewable tablet CHEW ONE TABLET BY MOUTH EVERY DAY ASK YOUR DOCTOR HOW LONG YOU SHOULD TAKE THIS MEDICATION 04/12/22   [provider]  atorvastatin  (LIPITOR) 20 MG tablet Take 20 mg by mouth daily.    [provider]  atorvastatin  (LIPITOR) 80 MG tablet TAKE ONE-HALF TABLET BY MOUTH AT BEDTIME FOR CHOLESTEROL 10/12/21   [provider]  calcitRIOL (ROCALTROL) 0.25 MCG capsule Take 1 capsule by mouth 2 (two) times daily. 04/12/22   [provider]  empagliflozin (JARDIANCE) 25 MG TABS tablet Take 0.5 tablets by mouth daily. 09/29/20   [provider]  ferrous sulfate 325 (65 FE) MG tablet Take 325 mg by mouth See admin instructions. Take one tablet on Monday, Wednesday, and Friday 11/09/20   [provider]  insulin  glargine-yfgn (SEMGLEE) 100 UNIT/ML Pen INJECT 38 UNITS UNDER SKIN EVERY MORNING BEFORE BREAKFAST FOR DIABETES.DISCARD PEN 28 DAYS AFTER OPENING**REPLACES LANTUS** 10/07/21    [provider]  lisinopril (ZESTRIL) 40 MG tablet TAKE ONE-HALF TABLET BY MOUTH EVERY DAY FOR BLOOD PRESSURE 04/12/22   [provider]  metoprolol  (LOPRESSOR ) 50 MG tablet Take 50 mg by mouth 2 (two) times daily.    [provider]  NON FORMULARY [facility]  ANTIFUNGAL (NAIL)-#1    [provider]  ondansetron  (ZOFRAN ) 8 MG tablet TAKE ONE-HALF TABLET BY MOUTH EVERY 8 HOURS AS NEEDED FOR NAUSEA 08/30/21   [provider]  pantoprazole (PROTONIX) 40 MG tablet TAKE ONE TABLET BY MOUTH ONCE DAILY BEFORE A MEAL TO CONTROL STOMACH ACID 04/01/22   [provider]  terazosin  (HYTRIN ) 10 MG capsule Take 1 capsule by mouth at bedtime. 04/12/22   [provider]    Allergies: Cholecalciferol, Glucophage [metformin], Drug class [niacin and related], and Niacin    Review of Systems  Updated Vital Signs BP (!) 163/63   Pulse 91   Temp (!) 97.5 F (36.4 C)   Resp 18   Ht 5' 9 (1.753 m)   Wt 81.6 kg   SpO2 94%   BMI 26.58 kg/m   Physical Exam Vitals and nursing note reviewed.  Constitutional:      General: He is not in acute distress.    Appearance: He is well-developed. He is not ill-appearing.  HENT:     Head: Normocephalic and atraumatic.     Mouth/Throat:     Mouth: Mucous membranes are moist.  Eyes:  Extraocular Movements: Extraocular movements intact.     Conjunctiva/sclera: Conjunctivae normal.     Pupils: Pupils are equal, round, and reactive to light.  Cardiovascular:     Rate and Rhythm: Normal rate and regular rhythm.     Pulses: Normal pulses.     Heart sounds: Normal heart sounds. No murmur heard. Pulmonary:     Effort: Pulmonary effort is normal. No respiratory distress.     Breath sounds: Rhonchi present. No decreased breath sounds, wheezing or rales.  Abdominal:     Palpations: Abdomen is soft.     Tenderness: There is no abdominal tenderness.  Genitourinary:    Comments: Unremarkable  hemorrhoid on rectal exam Musculoskeletal:        General: No swelling. Normal range of motion.     Cervical back: Normal range of motion and neck supple.     Right lower leg: Edema present.     Left lower leg: Edema present.  Skin:    General: Skin is warm and dry.     Capillary Refill: Capillary refill takes less than 2 seconds.  Neurological:     General: No focal deficit present.     Mental Status: He is alert.  Psychiatric:        Mood and Affect: Mood normal.     (all labs ordered are listed, but only abnormal results are displayed) Labs Reviewed  BASIC METABOLIC PANEL WITH GFR - Abnormal; Notable for the following components:      Result Value   CO2 16 (*)    Glucose, Bld 102 (*)    BUN 60 (*)    Creatinine, Ser 7.58 (*)    Calcium  8.3 (*)    GFR, Estimated 7 (*)    All other components within normal limits  CBC - Abnormal; Notable for the following components:   RBC 3.30 (*)    Hemoglobin 8.5 (*)    HCT 28.0 (*)    MCH 25.8 (*)    RDW 16.5 (*)    All other components within normal limits  BRAIN NATRIURETIC PEPTIDE - Abnormal; Notable for the following components:   B Natriuretic Peptide 336.7 (*)    All other components within normal limits  HEPATIC FUNCTION PANEL - Abnormal; Notable for the following components:   Albumin 2.4 (*)    All other components within normal limits  TROPONIN I (HIGH SENSITIVITY) - Abnormal; Notable for the following components:   Troponin I (High Sensitivity) 56 (*)    All other components within normal limits  RESP PANEL BY RT-PCR (RSV, FLU A&B, COVID)  RVPGX2  TROPONIN I (HIGH SENSITIVITY)    EKG: EKG Interpretation Date/Time:  Tuesday September 03 2024 07:26:52 EST Ventricular Rate:  83 PR Interval:  266 QRS Duration:  68 QT Interval:  378 QTC Calculation: 444 R Axis:   6  Text Interpretation: Sinus rhythm with 1st degree A-V block with Premature atrial complexes Confirmed by Ruthe Cornet [phone]) on 09/03/2024 7:28:34  AM  Radiology: DG Chest Portable 1 View Result Date: 09/03/2024 EXAM: 1 VIEW(S) XRAY OF THE CHEST 09/03/2024 07:49:00 AM COMPARISON: PA and lateral chest 10/20/2022. CLINICAL HISTORY: worsening coughing with shortness of breath for 2 weeks FINDINGS: LUNGS AND PLEURA: There is a new moderate partially loculated right and small layering left pleural effusions. There is patchy airspace disease in the left mid and lower lung fields and consolidation or atelectasis overlying the right pleural effusion. Probable multilobular pneumonia. The upper lung fields remain clear. No pulmonary  edema. No pneumothorax. HEART AND MEDIASTINUM: The cardiomediastinal silhouette and central vessels appear normal. There is patchy aortic calcific plaque. BONES AND SOFT TISSUES: Thoracic spondylosis with no acute osseous findings. IMPRESSION: 1. Probable multilobular pneumonia with patchy airspace disease in the left mid and lower lung fields and consolidation or atelectasis overlying the right pleural effusion. 2. New moderate partially loculated right and small layering left pleural effusions. 3. Clinical correlation and radiographic or chest CT follow-up recommended. Electronically signed by: Francis Quam MD 09/03/2024 07:59 AM EST RP Workstation: [id number]     Procedures   Medications Ordered in the ED - No data to display                                  Medical Decision Making Amount and/or Complexity of Data Reviewed Labs: ordered. Radiology: ordered.  Risk Decision regarding hospitalization.   [name] Pepper is here with shortness of breath and leg swelling.  Unremarkable vitals.  No fever.  EKG shows sinus rhythm.  No ischemic changes.  He is got significant pitting edema in his legs bilaterally to be some coarse breath sounds but no major increased work of breathing.  Vital signs are reassuring.  He has been having shortness of breath leg swelling for the last few weeks.  History of CKD.  He does have a  fistula but has not started dialysis.  He is not confused.  Differential diagnosis likely volume overload from CKD but will evaluate for heart failure or ACS pneumonia anemia electrolyte abnormality.  Is not having any abdominal pain.  This got relatively good work of breathing.  He is neurologically intact.  He also wanted me to look at his hemorrhoid which was unremarkable.  Continue stool softeners as needed for this.  Lab work shows a creatinine of 7.58.  This is significantly changed from labs in 2022.  Do not have any prior labs to compare to.  Otherwise there is no leukocytosis or major anemia.  BNP 336 and troponin 56 likely from CKD.  Chest x-ray shows new moderate size right sided pleural effusion and a left-sided pleural effusion.  I do not think clinically this is an infectious process.  I talked with pulmonology team and they recommend talking with nephrology first and dialyzing him likely prior to pursuing any sort of thoracentesis.  I talked with Dr. Dolan with nephrology who will evaluate the patient for dialysis.  Will likely start this.  Will admit to hospitalist for further care.  Inpatient team can talk with pulmonology team tomorrow if patient does get dialyzed and chest x-ray is not improving.  Patient stable on room air.  Will admit to medicine for further care.  This chart was dictated using voice recognition software.  Despite best efforts to proofread,  errors can occur which can change the documentation meaning.      Final diagnoses:  SOB (shortness of breath)  Pleural effusion  Chronic kidney disease, unspecified CKD stage  Hypervolemia, unspecified hypervolemia type    ED Discharge Orders     None          Ruthe Cornet, DO 09/03/24 9082

## 2024-09-03 NOTE — ED Triage Notes (Signed)
 Pt c/o shortness of breath worsening over past 2 weeks. Pt has had swelling in bilateral feet. Pt states he is also having hemorrhoid pain.

## 2024-09-03 NOTE — Assessment & Plan Note (Addendum)
 Patient is volume overloaded.  Elevated BNP, JVD, pleural effusions on CXR.  GFR of 7.  Likely secondary to progression to ESRD, but will work up for heart failure as well.  Possible cardiorenal component. - Admit to [facility] inpatient, Progressive unit, attending Dr. Anders - Vitals per unit routine - Consider Lasix for diuresis - TTE ordered - Repeat CXR ordered for tomorrow AM, reconsult pulmonology if concerned about pleural effusion - AM RFP, mag - Follow-up nephrology recommendations concerning starting HD, fistula already in place with palpable thrill

## 2024-09-03 NOTE — Assessment & Plan Note (Signed)
 Reportedly taking insulin  glargine 10 units daily outpatient.  Unclear if patient is taking empagliflozin or not. - CBGs ACHS - Very sensitive SSI - Restart long-acting insulin  as needed

## 2024-09-03 NOTE — Assessment & Plan Note (Signed)
 HLD: Hold home atorvastatin 

## 2024-09-03 NOTE — Progress Notes (Signed)
 Echocardiogram Attempted exam 1250pm. Patient unavailable.   Ernest Walker 09/03/2024, 12:49 PM

## 2024-09-03 NOTE — Assessment & Plan Note (Signed)
 State severe rectal pain - Pain regimen : Tylenol  1000 mg Q6H PRN - Hydrocortisone cream 1% BID - No NSAID d/t ERSD

## 2024-09-03 NOTE — Assessment & Plan Note (Signed)
 Cannot see nephrology records, however would stage as ESRD at this point given GFR <7.  Patient is not anuric. - Start heparin  TID for VTE prophylaxis for now given possible new initiation of HD, need to evaluate fistula - Follow-up nephrology recommendations

## 2024-09-03 NOTE — ED Notes (Signed)
 Advised to hold Lasix per dialysis team

## 2024-09-03 NOTE — Assessment & Plan Note (Signed)
-   Nephrology consulted and followed - iHD per Nephrology recommendation

## 2024-09-03 NOTE — Progress Notes (Signed)
 Echocardiogram 2D Echocardiogram has been performed.  Juliene JINNY Rucks 09/03/2024, 3:51 PM

## 2024-09-03 NOTE — ED Notes (Signed)
 Pt request ultrasound IV stating he does not want to be poked. Pt tearful regarding the needles. He requests lower arm IV only.

## 2024-09-03 NOTE — H&P (Addendum)
 Hospital Admission History and Physical Service Pager: [phone]  Patient name: Ernest Walker Medical record number: [id number] Date of Birth: 03-01-1946 Age: 78 y.o. Gender: male  Primary Care Provider: Dr. Kelton (?spelling[facility] Consultants: Nephrology [facility]), Pulmonology called in ED but would reconsulted if needed Code Status: FULL code, which was confirmed with patient and wife at bedside Preferred Emergency Contact: Contact Information     Name Relation Home Work Mobile   Walker,Ernest Spouse [phone]  [phone]      Other Contacts     Name Relation Home Work Mobile   Ernest Walker Son   [phone]      Chief Complaint: Dyspnea on exertion, bilateral lower extremity edema  Differential and Medical Decision Making: Ernest Walker is a 78 y.o. male presenting with worsening dyspnea on exertion and bilateral lower extremity edema for approximately 3 weeks.  Differential for this patient's presentation includes hypervolemia due to renal sufficiency in setting of progressing CKD, heart failure, and pneumonia.  Reassuringly, patient's respiration is stable on room air and though his work of breathing increases with exertion, he is not requiring supplemental oxygen at this time.  On physical exam, patient is clearly volume overloaded based on JVD and lower extreme edema, which is supported by CXR and BNP.  Given the patient's known progressive CKD, favor this is most likely etiology of volume overload.  However, cannot exclude CHF exacerbation.  Patient does not recall having any prior cardiac events or cardiac history, however he is at high risk of cardiac comorbidity given his CKD and T2DM.  Elevated BNP could be secondary to heart failure or volume overload in setting of CKD, no prior on record.  Did note elevated troponin, but nearly flat and EKG unremarkable; likely demand ischemia in setting of volume overload.  Will obtain echocardiogram to evaluate heart  function further.  It is possible there is a cardiorenal component to the patient's presentation.  Will await nephrology recommendations concerning further management of volume overload with HD versus medical diuresis.  Cannot see patient's prior nephrology notes, but appears to be in ESRD at this point based on his labs here.  His fistula was placed around January, likely matured and has palpable thrill today.  Do not see indication to consult vascular surgery at this time.  Should be prepared to start HD inpatient if needed.  Low concern for pneumonia, as patient has had no fevers, productive cough, and has no leukocytosis on labs.  Overall, no evidence of infectious process.  Will not pursue antibiotics at this time.  Per ED discussion with pulmonology, will reconsult tomorrow if pleural effusions persist on repeat CXR.  No concern for pulmonary embolism given no supplemental oxygen requirement, no chest pain, lower extremity edema is symmetrical bilaterally, and no significant risk factors.  Chart review limited by inability to access [facility]. Assessment & Plan Hypervolemia associated with renal insufficiency Pleural effusion, R>L Patient is volume overloaded.  Elevated BNP, JVD, pleural effusions on CXR.  GFR of 7.  Likely secondary to progression to ESRD, but will work up for heart failure as well.  Possible cardiorenal component. - Admit to FMTS inpatient, Progressive unit, attending Dr. Anders - Vitals per unit routine - Consider Lasix for diuresis - TTE ordered - Repeat CXR ordered for tomorrow AM, reconsult pulmonology if concerned about pleural effusion - AM RFP, mag - Follow-up nephrology recommendations concerning starting HD, fistula already in place with palpable thrill CKD (chronic  kidney disease) Cannot see nephrology records, however would stage as ESRD at this point given GFR <7.  Patient is not anuric. - Start heparin  TID for VTE prophylaxis  for now given possible new initiation of HD, need to evaluate fistula - Follow-up nephrology recommendations History of anemia due to chronic kidney disease Stable hemoglobin from 1 year ago.  Reportedly has received 6 doses of IV iron, most recently completed course on 10/29 - AM CBC Hypertension Moderately elevated BP.  Takes amlodipine  and lisinopril at home, but unsure of doses. - Restart home meds pending med rec, would continue to hold ACE in setting of renal dysfunction T2DM (type 2 diabetes mellitus) (HCC) Reportedly taking insulin  glargine 10 units daily outpatient.  Unclear if patient is taking empagliflozin or not. - CBGs ACHS - Very sensitive SSI - Restart long-acting insulin  as needed Hemorrhoid Benign likely external hemorrhoid. - Preparation H ordered inpatient - Follow-up with PCP outpatient Chronic health problem HLD: Hold home atorvastatin    FEN/GI: Renal diet with 1200 mL fluid restriction VTE Prophylaxis: Heparin  TID given potential new start of HD   Disposition: Progressive  History of Present Illness: Ernest Walker is a 78 y.o. 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  On FMTS evaluation, patient reported that for at least 3 weeks, he has experienced gradual onset of shortness of breath with exertion and worsening swelling of feet and legs.  At baseline, had minimal dyspnea on exertion and BLE edema, but it has progressed to the point that he is significantly short of breath after ambulating to restroom.  Reports he has been having to sit up in recliner more often to catch his breath after lying flat.  Does not use any supplemental oxygen.  Denies PND.  He had fistula placed in LUE around January with plan to initiate HD in the future.  Has been seeing [facility] recently, [place] before then.  Also reports painful hemorrhoid for about 3 weeks as  well.  It is uncomfortable and painful, sometimes he notices a small amount of bright red blood when wiping.  In the ED, patient's creatinine was 7.58, up from 2.16 approximately 3 years ago.  Hemoglobin low and stable from 1 year ago.  Initial troponin elevated to 56, but thought to be demand ischemia in setting of volume overload.  BNP elevated to 337.  CXR showing new moderate right-sided pleural effusion with smaller left-sided pleural effusion.  No evidence of infectious process (no leukocytosis, no fevers, no productive cough), no antibiotics started.  EDP consulted pulmonology, who recommended no thoracentesis right now, dialyzing patient, and reconsulting tomorrow if pleural effusion persist.  EDP also consult Dr. Dolan with [facility] for evaluation to consider starting HD.  FMTS consulted for admission for admission for medical management of volume overload.   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   Pertinent Past Surgical History: - Partial colectomy (no ostomy) in 2018 for polyps  Remainder reviewed in history tab.   Pertinent Social History: Tobacco use:  No, quit in 1977 Alcohol use: No Other Substance use: None Lives with wife, independent in ADLs and ambulatory at baseline.   Pertinent Family History: - Father died of lung cancer in his 53s - Father had diabetes - No heart disease  Remainder reviewed in history tab.    Important Outpatient Medications:  Current Outpatient Medications  Medication Instructions   amLODipine  (NORVASC ) 10 MG tablet Takes daily, unsure of dose   aspirin 81 MG  chewable tablet Stopped after April GIB   atorvastatin  (LIPITOR) 80 MG tablet Unsure of dose, takes daily   calcitRIOL (ROCALTROL) 0.25 MCG capsule Unsure if taking   empagliflozin (JARDIANCE) 25 MG TABS tablet Unsure if taking   Iron infusion Completed on 10/29 (had 6 infusions total)   insulin  glargine-yfgn (SEMGLEE) 100 UNIT/ML Pen 10 units once daily   lisinopril (ZESTRIL) 40 MG tablet 20 mg daily   metoprolol  tartrate (LOPRESSOR ) 50 mg BID   pantoprazole (PROTONIX) 40 MG tablet Not taking   terazosin  (HYTRIN ) 10 MG capsule Not taking   Remainder reviewed in medication history, awaiting med rec.   Objective: BP (!) 163/63   Pulse 91   Temp (!) 97.5 F (36.4 C)   Resp 18   Ht 5' 9 (1.753 m)   Wt 81.6 kg   SpO2 94%   BMI 26.58 kg/m   Physical Exam: General: Adult male resting in bed in no acute distress.  Alert and at baseline. Eyes: No scleral icterus or injections.  No conjunctival pallor. ENTM: MMM.  No oral lesions. Neck: Smooth thyroid.  JVD as below. Cardiovascular: RRR.  No murmur/rub/gallops.  Notable JVP 4-5 cm above sternal angle, positive hepatojugular reflex. Respiratory: Diminished breath sounds at lung bases bilaterally, R>L.  Few scattered rhonchi present.  No crackles or wheezes.  Normal work of breathing on room air unless exerting self. Gastrointestinal: Soft, nondistended abdomen without tenderness in all quadrants.  Normoactive bowel sounds. GU: Small external hemorrhoid visible at rectal opening. MSK: Full ROM of all extremities, able to sit up with support. Extremities: LUE AV fistula with 2+ palpable thrill.  2+ pitting edema to knee bilaterally. Derm: No rashes grossly. Neuro: No focal neurological deficit.  Moderately hard of hearing. Psych: Pleasant, appropriate.  Labs:  CBC BMET  Recent Labs  Lab 09/03/24 0733  WBC 7.8  HGB 8.5*  HCT 28.0*  PLT 237   Recent Labs  Lab 09/03/24 0733  NA 139  K 5.1  CL 109  CO2 16*  BUN 60*   CREATININE 7.58*  GLUCOSE 102*  CALCIUM  8.3*     Pertinent additional labs: - BNP 337 - Hemoglobin 8.5, was 8.4 approximately 1-year ago - Troponin 56>61 - Creatinine 7.58, was 2.16 approximately 3 years ago - Quad RPP pending  EKG: Regular rhythm, 1st degree heart block without bradycardia, QTcB 444.  Imaging Studies: - CXR: Large right sided pleural effusion, smaller left pleural effusion. Some patchy airway disease, lower concern for pneumonia given clinical correlation.  Toma Samy Ryner, MD 09/03/2024, 9:49 AM PGY-2, [facility]  FPTS Intern pager: [phone], text pages welcome Secure chat group [facility] Teaching Service

## 2024-09-03 NOTE — Assessment & Plan Note (Signed)
 Benign likely external hemorrhoid. - Preparation H ordered inpatient - Follow-up with PCP outpatient

## 2024-09-03 NOTE — Assessment & Plan Note (Addendum)
 Moderately elevated BP.  Takes amlodipine  and lisinopril at home, but unsure of doses. - Restart home meds pending med rec, would continue to hold ACE in setting of renal dysfunction

## 2024-09-04 ENCOUNTER — Inpatient Hospital Stay (HOSPITAL_COMMUNITY)

## 2024-09-04 DIAGNOSIS — E8770 Fluid overload, unspecified: Secondary | ICD-10-CM | POA: Diagnosis not present

## 2024-09-04 DIAGNOSIS — N189 Chronic kidney disease, unspecified: Secondary | ICD-10-CM

## 2024-09-04 DIAGNOSIS — R0602 Shortness of breath: Secondary | ICD-10-CM

## 2024-09-04 DIAGNOSIS — I5030 Unspecified diastolic (congestive) heart failure: Secondary | ICD-10-CM | POA: Diagnosis present

## 2024-09-04 DIAGNOSIS — J90 Pleural effusion, not elsewhere classified: Secondary | ICD-10-CM

## 2024-09-04 LAB — CBC
HCT: 27.6 % — ABNORMAL LOW (ref 39.0–52.0)
Hemoglobin: 8.6 g/dL — ABNORMAL LOW (ref 13.0–17.0)
MCH: 26.3 pg (ref 26.0–34.0)
MCHC: 31.2 g/dL (ref 30.0–36.0)
MCV: 84.4 fL (ref 80.0–100.0)
Platelets: 231 K/uL (ref 150–400)
RBC: 3.27 MIL/uL — ABNORMAL LOW (ref 4.22–5.81)
RDW: 16.1 % — ABNORMAL HIGH (ref 11.5–15.5)
WBC: 8.9 K/uL (ref 4.0–10.5)
nRBC: 0 % (ref 0.0–0.2)

## 2024-09-04 LAB — IRON AND TIBC
Iron: 20 ug/dL — ABNORMAL LOW (ref 45–182)
Saturation Ratios: 15 % — ABNORMAL LOW (ref 17.9–39.5)
TIBC: 132 ug/dL — ABNORMAL LOW (ref 250–450)
UIBC: 112 ug/dL

## 2024-09-04 LAB — RENAL FUNCTION PANEL
Albumin: 2.2 g/dL — ABNORMAL LOW (ref 3.5–5.0)
Anion gap: 11 (ref 5–15)
BUN: 40 mg/dL — ABNORMAL HIGH (ref 8–23)
CO2: 24 mmol/L (ref 22–32)
Calcium: 8.3 mg/dL — ABNORMAL LOW (ref 8.9–10.3)
Chloride: 109 mmol/L (ref 98–111)
Creatinine, Ser: 6.08 mg/dL — ABNORMAL HIGH (ref 0.61–1.24)
GFR, Estimated: 9 mL/min — ABNORMAL LOW (ref >60)
Glucose, Bld: 90 mg/dL (ref 70–99)
Phosphorus: 4.4 mg/dL (ref 2.5–4.6)
Potassium: 4.7 mmol/L (ref 3.5–5.1)
Sodium: 144 mmol/L (ref 135–145)

## 2024-09-04 LAB — GLUCOSE, CAPILLARY
Glucose-Capillary: 79 mg/dL (ref 70–99)
Glucose-Capillary: 72 mg/dL (ref 70–99)
Glucose-Capillary: 86 mg/dL (ref 70–99)
Glucose-Capillary: 85 mg/dL (ref 70–99)
Glucose-Capillary: 152 mg/dL — ABNORMAL HIGH (ref 70–99)

## 2024-09-04 LAB — MAGNESIUM: Magnesium: 2 mg/dL (ref 1.7–2.4)

## 2024-09-04 LAB — FERRITIN: Ferritin: 576 ng/mL — ABNORMAL HIGH (ref 24–336)

## 2024-09-04 LAB — HEMOGLOBIN A1C
Hgb A1c MFr Bld: 6.2 % — ABNORMAL HIGH (ref 4.8–5.6)
Mean Plasma Glucose: 131.24 mg/dL

## 2024-09-04 MED ORDER — ANTICOAGULANT SODIUM CITRATE 4% (200MG/5ML) IV SOLN: 5.0000 mL | Status: DC | PRN

## 2024-09-04 MED ORDER — LIDOCAINE HCL (PF) 1 % IJ SOLN: 5.0000 mL | INTRAMUSCULAR | Status: DC | PRN

## 2024-09-04 MED ORDER — LIDOCAINE-PRILOCAINE 2.5-2.5 % EX CREA: 1.0000 | TOPICAL_CREAM | CUTANEOUS | Status: DC | PRN

## 2024-09-04 MED ORDER — CHLORHEXIDINE GLUCONATE CLOTH 2 % EX PADS: 6.0000 | MEDICATED_PAD | Freq: Every day | CUTANEOUS | Status: DC

## 2024-09-04 MED ORDER — IOHEXOL 350 MG/ML SOLN
50.0000 mL | Freq: Once | INTRAVENOUS | Status: AC | PRN
  Administered 2024-09-04: 50 mL via INTRAVENOUS

## 2024-09-04 MED ORDER — PENTAFLUOROPROP-TETRAFLUOROETH EX AERO
1.0000 | INHALATION_SPRAY | CUTANEOUS | Status: DC | PRN
Start: 2024-09-04 — End: 2024-09-04

## 2024-09-04 MED ORDER — HEPARIN SODIUM (PORCINE) 1000 UNIT/ML DIALYSIS: 1000.0000 [IU] | INTRAMUSCULAR | Status: DC | PRN

## 2024-09-04 NOTE — Assessment & Plan Note (Addendum)
 Remains on room air at rest. Still with bilateral lower extremity edema. - Nephrology following, appreciate recs - Recommending additional dialysis today 11/12; no need for diuretics - Labs: AM RFP - PTH pending - PT/OT eval and treat - Fall precautions - Delirium precautions
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
## 2024-09-04 NOTE — Progress Notes (Signed)
 Daily Progress Note Intern Pager: [phone]  Patient name: Ernest Walker Medical record number: [id number] Date of birth: [birth year] Age: 78 y.o. Gender: male  [facility]: [facility]phrology [facility]); pulm called in ED (s/o) Code Status: Full  Pt Overview and Major Events to Date:  - 11/11: Admitted, nephro consulted  Medical Decision Making:  Ernest Walker is a 78 y.o. male with PMH of T2DM, ESRD (new this admission; known CKD), HTN, HLD. Admitted for hypervolemia suspected 2/2 renal insufficiency. Initiated dialysis 11/11; got off. Echo done 11/11 with LVEF 50%, Grade II diastolic dysfunction. Also found to have pleural effusions vs opacities, R>L; not much improved on CXR done this morning. Assessment & Plan Hypervolemia associated with renal insufficiency ESRD (end stage renal disease) (HCC) SOB (shortness of breath) (HFpEF) heart failure with preserved ejection fraction (HCC) Remains on room air at rest. Still with bilateral lower extremity edema. - Nephrology following, appreciate recs - Recommending additional dialysis today 11/12; no need for diuretics - Labs: AM RFP - PTH pending - PT/OT eval and treat - Fall precautions - Delirium precautions Pleural effusion, R>L - CT Chest with contrast to rule out malignancy as cause of pleural effusion; discussed use of contrast with Dr. Dolan in nephro and given okay to proceed - Consider pulm re-consult for possible thoracentesis pending CT Chest and further dialysis - Pain regimen: Tylenol  1000 mg q6h PRN Hemorrhoid - Pain regimen: Tylenol  1000 mg Q6H PRN - No NSAIDs d/t ERSD - Hydrocortisone cream 1% BID Chronic health problem HLD: Hold home atorvastatin  40 mg at bedtime HTN: Holding home amlodipine  10 mg daily, lisinopril 20 mg daily (lisinopril not filled since 11/2023) T2DM: Holding home insulin  glargine 10u daily; on vsSSI currently during admission  FEN/GI:  Renal diet with 1200 mL fluid restriction PPx: Heparin  TID Dispo:Home pending clinical improvement . Barriers include continued workup and tx of hypervolemia and pleural effusions; need for outpatient dialysis to be set up.   Subjective:  Patient continues to report some shortness of breath, especially with lying down, but overall reports his breathing is much improved from admission.  He also reports improved bilateral lower extremity edema.  Objective: Temp:  [97.4 F (36.3 C)-99.1 F (37.3 C)] 97.5 F (36.4 C) (11/12 0556) Pulse Rate:  [78-100] 88 (11/12 0556) Resp:  [17-31] 17 (11/12 0556) BP: (112-176)/(62-96) 148/96 (11/12 0556) SpO2:  [94 %-100 %] 94 % (11/12 0556) Weight:  [81.1 kg-81.6 kg] 81.1 kg (11/11 2000) Physical Exam: General: Patient is sitting up in bed, no acute distress. Cardiovascular: Regular rate and rhythm, no murmurs/rubs/gallops. Respiratory: Normal work of breathing on room air.  Amplified breath sounds in right lower lobe; no wheezes, crackles. Abdomen: Bowel sounds present and normoactive bilaterally. Soft, nondistended, nontender. Extremities: Extremities: Skin warm, dry. 1-2+ bilateral lower extremity pitting edema.  Laboratory: Most recent CBC Lab Results  Component Value Date   WBC 8.4 09/03/2024   HGB 8.8 (L) 09/03/2024   HCT 27.7 (L) 09/03/2024   MCV 81.7 09/03/2024   PLT 231 09/03/2024   Most recent BMP    Latest Ref Rng & Units 09/03/2024    9:44 PM  BMP  Glucose 70 - 99 mg/dL 888   BUN 8 - 23 mg/dL 37   Creatinine 9.38 - 1.24 mg/dL 4.44   Sodium 864 - 854 mmol/L 141   Potassium 3.5 - 5.1 mmol/L 4.3   Chloride 98 - 111 mmol/L 107   CO2  22 - 32 mmol/L 22   Calcium  8.9 - 10.3 mg/dL 8.1    J8r pending   Imaging/Diagnostic Tests: CXR 11/12: My interpretation: Large opacity of RLL, largely unchanged from prior CXR 11/11. Small opacity in left midcentral lung, largely unchanged from prior CXR. Radiologist's interpretation:1. Similar  appearance of the collapse/consolidative opacity in the right lower lung with moderate right pleural effusion. 2. Focal masslike opacity in the parahilar left mid lung. Potentially related to multifocal pneumonia, close follow-up recommended to ensure complete resolution.  Larraine Palma, MD 09/04/2024, 7:13 AM  PGY-1, [facility] FPTS Intern pager: [phone], text pages welcome Secure chat group [facility] Teaching Service

## 2024-09-04 NOTE — Assessment & Plan Note (Addendum)
-   CT Chest with contrast to rule out malignancy as cause of pleural effusion; discussed use of contrast with Dr. Dolan in nephro and given okay to proceed - Consider pulm re-consult for possible thoracentesis pending CT Chest and further dialysis - Pain regimen: Tylenol  1000 mg q6h PRN

## 2024-09-04 NOTE — Progress Notes (Signed)
 Heart Failure Navigator Progress Note  Assessed for Heart & Vascular TOC clinic readiness.  Patient does not meet criteria due to ESRD on hemodialysis. No HF TOC.   Navigator will sign off at this time.   Randie Bustle, BSN, Scientist, clinical (histocompatibility and immunogenetics) Only

## 2024-09-04 NOTE — Assessment & Plan Note (Signed)
 HLD: Hold home atorvastatin  40 mg at bedtime HTN: Holding home amlodipine  10 mg daily, lisinopril 20 mg daily (lisinopril not filled since 11/2023) T2DM: Holding home insulin  glargine 10u daily; on vsSSI currently during admission

## 2024-09-04 NOTE — Progress Notes (Signed)
   09/04/24 1802  Vitals  Temp 99 F (37.2 C)  Pulse Rate 82  Resp (!) 21  BP (!) 159/72  SpO2 97 %  O2 Device Room Air  Weight 77.5 kg  Oxygen Therapy  Patient Activity (if Appropriate) In bed  Pulse Oximetry Type Continuous  Oximetry Probe Site Changed No  Post Treatment  Dialyzer Clearance Lightly streaked  Liters Processed 37.9  Fluid Removed (mL) 1000 mL  Tolerated HD Treatment Yes  Post-Hemodialysis Comments Pt. tolerated tx well  AVG/AVF Arterial Site Held (minutes) 10 minutes  AVG/AVF Venous Site Held (minutes) 10 minutes

## 2024-09-04 NOTE — Assessment & Plan Note (Signed)
-   Pain regimen: Tylenol  1000 mg Q6H PRN - No NSAIDs d/t ERSD - Hydrocortisone cream 1% BID
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
## 2024-09-04 NOTE — Progress Notes (Signed)
 [facility] NEPHROLOGY PROGRESS NOTE  Assessment/ Plan: Pt is a 78 y.o. 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  Followed by [name] at [facility] and at the [facility].  He has LUE AV fistula created on 11/20/2023. -First HD on 11/11, cannulated AV fistula well.  He tolerated HD well. -Plan for second dialysis today. -Renal navigator is following to arrange outpatient dialysis.   # Fluid overload/lower extreme edema: Continue to ultrafiltrate with dialysis, recommend fluid restriction.     # Hypertension: Managing volume with dialysis, holding antihypertensives.   # Anemia of CKD: Patient used to get iron infusion as outpatient.  We will check iron level and consider erythropoietin treatment.   # CKD-MBD: Checking PTH level.  Monitor calcium  and phosphorus level.  Subjective: Seen and examined at bedside.  He reports feeling better after 1 dialysis.  He looks more alert and awake.  Denies nausea, vomiting, chest pain or shortness of breath.  No new event overnight. Objective Vital signs in last 24 hours: Vitals:   09/03/24 2300 09/04/24 0556 09/04/24 0720 09/04/24 1117  BP: 112/66 (!) 148/96 139/62   Pulse:  88 87 (!) 122  Resp: 18 17 19    Temp: 98.1 F (36.7 C) (!) 97.5 F (36.4 C) 98 F (36.7 C)   TempSrc: Oral Oral Oral   SpO2: 96% 94% 96% (!) 88%  Weight:      Height:       Weight change:   Intake/Output Summary (Last 24 hours) at 09/04/2024 1130 Last data filed at 09/04/2024 0900 Gross per 24 hour  Intake 170 ml  Output 550 ml  Net -380 ml        Labs: RENAL PANEL Recent Labs  Lab 09/03/24 0733 09/03/24 2144  NA 139 141  K 5.1 4.3  CL 109 107  CO2 16* 22  GLUCOSE 102* 111*  BUN 60* 37*  CREATININE 7.58* 5.55*  CALCIUM  8.3* 8.1*  PHOS  --  4.0  ALBUMIN 2.4* 2.3*    Liver Function Tests: Recent Labs  Lab 09/03/24 0733 09/03/24 2144  AST 15  --   ALT 9  --   ALKPHOS 47  --   BILITOT 0.5  --   PROT 6.7  --   ALBUMIN 2.4* 2.3*   No results for input(s): LIPASE, AMYLASE in the last 168 hours. No results for input(s): AMMONIA in the last 168 hours. CBC: Recent Labs    09/03/24 0733 09/03/24 2144  HGB 8.5* 8.8*  MCV 84.8 81.7    Cardiac Enzymes: No results for input(s): CKTOTAL, CKMB, CKMBINDEX, TROPONINI in the last 168 hours. CBG: Recent Labs  Lab 09/03/24 1144 09/03/24 2110 09/04/24 0559  GLUCAP 86 79 72    Iron Studies: No results for input(s): IRON, TIBC, TRANSFERRIN, FERRITIN in the last 72 hours. Studies/Results: DG CHEST PORT 1 VIEW Result Date: 09/04/2024 CLINICAL DATA:  Dyspnea. EXAM: PORTABLE CHEST 1 VIEW COMPARISON:  09/03/2024 FINDINGS: Similar appearance of the collapse/consolidative opacity in the right lower lung with moderate right pleural effusion. Focal masslike opacity identified parahilar left mid lung without substantial left pleural effusion.  The cardio pericardial silhouette is enlarged. Telemetry leads overlie the chest. IMPRESSION: 1. Similar appearance of the collapse/consolidative opacity in the right lower lung with moderate right pleural effusion. 2. Focal masslike opacity in the parahilar left mid lung. Potentially related to multifocal pneumonia, close follow-up recommended to ensure complete resolution. Electronically Signed   By: Camellia Candle M.D.   On: 09/04/2024 07:08   ECHOCARDIOGRAM COMPLETE Result Date: 09/03/2024    ECHOCARDIOGRAM REPORT   Patient Name:   Ernest Walker Date of Exam: 09/03/2024 Medical Rec #:  [id number]      Height:        69.0 in Accession #:    [id number]     Weight:       180.0 lb Date of Birth:  21-Jun-1946     BSA:          1.976 m Patient Age:    78 years       BP:           154/75 mmHg Patient Gender: M              HR:           105 bpm. Exam Location:  Inpatient Procedure: 2D Echo, Cardiac Doppler and Color Doppler (Both Spectral and Color            Flow Doppler were utilized during procedure). Indications:    Dyspnea  History:        Patient has no prior history of Echocardiogram examinations.                 Signs/Symptoms:Dyspnea, Shortness of Breath and Bacteremia; Risk                 Factors:Hypertension, Diabetes, Former Smoker and Dyslipidemia.  Sonographer:    VALENTE, ADAM Referring Phys: CLAUDETTA SILENCE IMPRESSIONS  1. Left ventricular ejection fraction, by estimation, is 50%. The left ventricle has low normal function. The left ventricle demonstrates global hypokinesis. There is mild concentric left ventricular hypertrophy. Left ventricular diastolic parameters are consistent with Grade II diastolic dysfunction (pseudonormalization). Elevated left atrial pressure.  2. Right ventricular systolic function is normal. The right ventricular size is normal. There is normal pulmonary artery systolic pressure. The estimated right ventricular systolic pressure is 32.4 mmHg.  3. Left atrial size was moderately dilated.  4. Right atrial size was mildly dilated.  5. The mitral valve is normal in structure. No evidence of mitral valve regurgitation.  6. Tricuspid valve regurgitation is mild to moderate.  7. The aortic valve is tricuspid. There is mild thickening of the aortic valve. Aortic valve regurgitation is not visualized. Aortic valve sclerosis is present, with no evidence of aortic valve stenosis.  8. The inferior vena cava is dilated in size with >50% respiratory variability, suggesting right atrial pressure of 8 mmHg. FINDINGS  Left Ventricle: Left ventricular ejection fraction, by estimation, is 50%. The left  ventricle has low normal function. The left ventricle demonstrates global hypokinesis. The left ventricular internal cavity size was normal in size. There is mild concentric left ventricular hypertrophy. Left ventricular diastolic parameters are consistent with Grade II diastolic dysfunction (pseudonormalization). Elevated left atrial pressure. Right Ventricle: The right ventricular size is normal. No increase in right ventricular wall thickness. Right ventricular systolic function is normal. There is normal pulmonary artery systolic pressure. The tricuspid regurgitant velocity is 2.47 m/s, and  with an assumed right atrial pressure of 8 mmHg, the estimated right ventricular systolic pressure is 32.4  mmHg. Left Atrium: Left atrial size was moderately dilated. Right Atrium: Right atrial size was mildly dilated. Pericardium: There is no evidence of pericardial effusion. Mitral Valve: The mitral valve is normal in structure. No evidence of mitral valve regurgitation. Tricuspid Valve: The tricuspid valve is normal in structure. Tricuspid valve regurgitation is mild to moderate. Aortic Valve: The aortic valve is tricuspid. There is mild thickening of the aortic valve. Aortic valve regurgitation is not visualized. Aortic valve sclerosis is present, with no evidence of aortic valve stenosis. Pulmonic Valve: The pulmonic valve was grossly normal. Pulmonic valve regurgitation is trivial. No evidence of pulmonic stenosis. Aorta: The aortic root and ascending aorta are structurally normal, with no evidence of dilitation. Venous: The inferior vena cava is dilated in size with greater than 50% respiratory variability, suggesting right atrial pressure of 8 mmHg. IAS/Shunts: No atrial level shunt detected by color flow Doppler.  LEFT VENTRICLE PLAX 2D LVIDd:         4.40 cm   Diastology LVIDs:         3.30 cm   LV e' medial:    6.53 cm/s LV PW:         1.40 cm   LV E/e' medial:  13.1 LV IVS:        1.40 cm   LV e' lateral:   13.40  cm/s LVOT diam:     2.00 cm   LV E/e' lateral: 6.4 LV SV:         72 LV SV Index:   36 LVOT Area:     3.14 cm  RIGHT VENTRICLE             IVC RV Basal diam:  4.10 cm     IVC diam: 1.80 cm RV Mid diam:    3.10 cm RV S prime:     13.10 cm/s TAPSE (M-mode): 2.2 cm LEFT ATRIUM             Index        RIGHT ATRIUM           Index LA diam:        3.90 cm 1.97 cm/m   RA Area:     19.00 cm LA Vol (A2C):   72.8 ml 36.85 ml/m  RA Volume:   60.70 ml  30.73 ml/m LA Vol (A4C):   88.8 ml 44.95 ml/m LA Biplane Vol: 87.7 ml 44.39 ml/m  AORTIC VALVE LVOT Vmax:   129.00 cm/s LVOT Vmean:  84.400 cm/s LVOT VTI:    0.228 m  AORTA Ao Root diam: 2.90 cm MV E velocity: 85.48 cm/s  TRICUSPID VALVE MV A velocity: 76.06 cm/s  TR Peak grad:   24.4 mmHg MV E/A ratio:  1.12        TR Vmax:        247.00 cm/s                             SHUNTS                            Systemic VTI:  0.23 m                            Systemic Diam: 2.00 cm Jerel Croitoru MD Electronically signed by Jerel Balding MD Signature Date/Time: 09/03/2024/5:22:56 PM    Final  DG Chest Portable 1 View Result Date: 09/03/2024 EXAM: 1 VIEW(S) XRAY OF THE CHEST 09/03/2024 07:49:00 AM COMPARISON: PA and lateral chest 10/20/2022. CLINICAL HISTORY: worsening coughing with shortness of breath for 2 weeks FINDINGS: LUNGS AND PLEURA: There is a new moderate partially loculated right and small layering left pleural effusions. There is patchy airspace disease in the left mid and lower lung fields and consolidation or atelectasis overlying the right pleural effusion. Probable multilobular pneumonia. The upper lung fields remain clear. No pulmonary edema. No pneumothorax. HEART AND MEDIASTINUM: The cardiomediastinal silhouette and central vessels appear normal. There is patchy aortic calcific plaque. BONES AND SOFT TISSUES: Thoracic spondylosis with no acute osseous findings. IMPRESSION: 1. Probable multilobular pneumonia with patchy airspace disease in the left mid  and lower lung fields and consolidation or atelectasis overlying the right pleural effusion. 2. New moderate partially loculated right and small layering left pleural effusions. 3. Clinical correlation and radiographic or chest CT follow-up recommended. Electronically signed by: Francis Quam MD 09/03/2024 07:59 AM EST RP Workstation: [id number]    Medications: Infusions:  anticoagulant sodium citrate      Scheduled Medications:  Chlorhexidine  Gluconate Cloth  6 each Topical Q0600   heparin   5,000 Units Subcutaneous Q8H   hydrocortisone cream   Topical BID   insulin  aspart  0-6 Units Subcutaneous TID WC   sodium chloride  flush  3 mL Intravenous Q12H    have reviewed scheduled and prn medications.  Physical Exam: General:NAD, comfortable Heart:RRR, s1s2 nl Lungs:clear b/l, no crackle Abdomen:soft, Non-tender, non-distended Extremities: Bilateral lower extremities pitting edema ++ Dialysis Access: Left AV fistula has a good thrill.  Camella Seim Prasad Makel Mcmann 09/04/2024,11:30 AM  LOS: 1 day

## 2024-09-04 NOTE — Progress Notes (Signed)
 Came in on bed, awake and oriented with no complaints. Consent signed and on file, Pt. Started with no complaints  UF goal: Tx duration:2.5 hours  Access used: Left AVF Access issue: None   Pt. Completed and tolerated tx. Pressure dressing applied. Endorsed to floor nurse.  Transported to room.  Mariyah Upshaw Rubi B Michaelle Bottomley, RN Kidney Dialysis Unit

## 2024-09-04 NOTE — Progress Notes (Addendum)
 Met bedside with patient to discuss out-pt HD clinic arrangements and options.   During this conversation, pt verified address on file and verified that his kidney MD/ nephrologist is [name]. He would prefer to stay with the [facility] if possible. Regarding transportation, he claims his wife will be taking him to and from his appointments. When asked about insurance, pt stated he would rather go through the [facility] instead of his [facility].   Navigator has been attempting to contact the [facility] which he is a pt of, in [place], with no feedback. All staff are on vacation. Will continue attempts to reach staff as to determine what his benefits will cover and if he can be seen at a [facility] clinic in [place], as his [facility] is in [place]. [place] if they will allow this.   While awaiting [facility] to return call, Navigator will go ahead and submit referral to [facility] admissions in the mean time for a MWF, 2nd shift clinic spot in [place] per pt request.   Lavanda Fredrickson Dialysis Navigator [phone]  Addendum 1141am Spoke with [facility] in [place], he is good to be seen at [facility] in [place]. Referral has been submitted to [facility] admissions for review. Will continue to assist and update.   Addendum 2:30pm Contacted by admissions that pt will likely have a spot at [facility] for preferred shift. Have attempted to contact [facility] to inform, no answer. Confidential VM sent. Will now need to obtain financial and medical clearance. Also, still awaiting hep b antibody to result at this time, will submit once available. Will continue to update as possible.

## 2024-09-05 ENCOUNTER — Inpatient Hospital Stay (HOSPITAL_COMMUNITY)

## 2024-09-05 DIAGNOSIS — E041 Nontoxic single thyroid nodule: Secondary | ICD-10-CM

## 2024-09-05 DIAGNOSIS — N186 End stage renal disease: Secondary | ICD-10-CM | POA: Diagnosis not present

## 2024-09-05 DIAGNOSIS — R911 Solitary pulmonary nodule: Secondary | ICD-10-CM | POA: Diagnosis not present

## 2024-09-05 DIAGNOSIS — N289 Disorder of kidney and ureter, unspecified: Secondary | ICD-10-CM

## 2024-09-05 DIAGNOSIS — R918 Other nonspecific abnormal finding of lung field: Secondary | ICD-10-CM

## 2024-09-05 DIAGNOSIS — E8770 Fluid overload, unspecified: Secondary | ICD-10-CM | POA: Diagnosis not present

## 2024-09-05 DIAGNOSIS — J90 Pleural effusion, not elsewhere classified: Secondary | ICD-10-CM | POA: Diagnosis not present

## 2024-09-05 LAB — RENAL FUNCTION PANEL
Albumin: 2.1 g/dL — ABNORMAL LOW (ref 3.5–5.0)
Anion gap: 9 (ref 5–15)
BUN: 25 mg/dL — ABNORMAL HIGH (ref 8–23)
CO2: 26 mmol/L (ref 22–32)
Calcium: 7.9 mg/dL — ABNORMAL LOW (ref 8.9–10.3)
Chloride: 103 mmol/L (ref 98–111)
Creatinine, Ser: 4.53 mg/dL — ABNORMAL HIGH (ref 0.61–1.24)
GFR, Estimated: 12 mL/min — ABNORMAL LOW (ref >60)
Glucose, Bld: 91 mg/dL (ref 70–99)
Phosphorus: 3.7 mg/dL (ref 2.5–4.6)
Potassium: 4.3 mmol/L (ref 3.5–5.1)
Sodium: 138 mmol/L (ref 135–145)

## 2024-09-05 LAB — CBC
HCT: 26.2 % — ABNORMAL LOW (ref 39.0–52.0)
Hemoglobin: 8.1 g/dL — ABNORMAL LOW (ref 13.0–17.0)
MCH: 25.6 pg — ABNORMAL LOW (ref 26.0–34.0)
MCHC: 30.9 g/dL (ref 30.0–36.0)
MCV: 82.9 fL (ref 80.0–100.0)
Platelets: 225 K/uL (ref 150–400)
RBC: 3.16 MIL/uL — ABNORMAL LOW (ref 4.22–5.81)
RDW: 15.8 % — ABNORMAL HIGH (ref 11.5–15.5)
WBC: 9.1 K/uL (ref 4.0–10.5)
nRBC: 0 % (ref 0.0–0.2)

## 2024-09-05 LAB — GLUCOSE, CAPILLARY
Glucose-Capillary: 79 mg/dL (ref 70–99)
Glucose-Capillary: 99 mg/dL (ref 70–99)
Glucose-Capillary: 120 mg/dL — ABNORMAL HIGH (ref 70–99)

## 2024-09-05 LAB — PARATHYROID HORMONE, INTACT (NO CA): PTH: 107 pg/mL — ABNORMAL HIGH (ref 15–65)

## 2024-09-05 MED ORDER — LIDOCAINE HCL (PF) 1 % IJ SOLN: 5.0000 mL | INTRAMUSCULAR | Status: DC | PRN

## 2024-09-05 MED ORDER — HEPARIN SODIUM (PORCINE) 5000 UNIT/ML IJ SOLN
5000.0000 [IU] | Freq: Three times a day (TID) | INTRAMUSCULAR | Status: AC
  Administered 2024-09-05: 5000 [IU] via SUBCUTANEOUS
  Filled 2024-09-05: qty 1

## 2024-09-05 MED ORDER — ANTICOAGULANT SODIUM CITRATE 4% (200MG/5ML) IV SOLN: 5.0000 mL | Status: DC | PRN

## 2024-09-05 MED ORDER — PENTAFLUOROPROP-TETRAFLUOROETH EX AERO
INHALATION_SPRAY | CUTANEOUS | Status: AC
  Filled 2024-09-05: qty 30

## 2024-09-05 MED ORDER — LIDOCAINE-PRILOCAINE 2.5-2.5 % EX CREA
1.0000 | TOPICAL_CREAM | CUTANEOUS | Status: DC | PRN
Start: 2024-09-05 — End: 2024-09-05

## 2024-09-05 MED ORDER — HEPARIN SODIUM (PORCINE) 1000 UNIT/ML DIALYSIS: 1000.0000 [IU] | INTRAMUSCULAR | Status: DC | PRN

## 2024-09-05 MED ORDER — SODIUM CHLORIDE 0.9 % IV SOLN
200.0000 mg | INTRAVENOUS | Status: AC
  Administered 2024-09-05 – 2024-09-07 (×3): 200 mg via INTRAVENOUS
  Filled 2024-09-05 (×3): qty 10

## 2024-09-05 MED ORDER — POLYETHYLENE GLYCOL 3350 17 G PO PACK
17.0000 g | PACK | Freq: Every day | ORAL | Status: DC
  Administered 2024-09-05: 17 g via ORAL
  Filled 2024-09-05 (×2): qty 1

## 2024-09-05 MED ORDER — ALTEPLASE 2 MG IJ SOLR: 2.0000 mg | Freq: Once | INTRAMUSCULAR | Status: DC | PRN

## 2024-09-05 MED ORDER — NEPRO/CARBSTEADY PO LIQD
237.0000 mL | Freq: Two times a day (BID) | ORAL | Status: DC
  Administered 2024-09-06 – 2024-09-07 (×3): 237 mL via ORAL

## 2024-09-05 MED ORDER — DARBEPOETIN ALFA 60 MCG/0.3ML IJ SOSY
60.0000 ug | PREFILLED_SYRINGE | INTRAMUSCULAR | Status: DC
  Administered 2024-09-05: 60 ug via SUBCUTANEOUS
  Filled 2024-09-05 (×2): qty 0.3

## 2024-09-05 MED ORDER — HEPARIN SODIUM (PORCINE) 5000 UNIT/ML IJ SOLN: 5000.0000 [IU] | Freq: Three times a day (TID) | INTRAMUSCULAR | Status: DC

## 2024-09-05 MED ORDER — CHLORHEXIDINE GLUCONATE CLOTH 2 % EX PADS: 6.0000 | MEDICATED_PAD | Freq: Every day | CUTANEOUS | Status: DC

## 2024-09-05 MED ORDER — AMOXICILLIN-POT CLAVULANATE 500-125 MG PO TABS
1.0000 | ORAL_TABLET | Freq: Every day | ORAL | Status: DC
  Administered 2024-09-05 – 2024-09-06 (×2): 1 via ORAL
  Filled 2024-09-05 (×3): qty 1

## 2024-09-05 MED ORDER — PENTAFLUOROPROP-TETRAFLUOROETH EX AERO: 1.0000 | INHALATION_SPRAY | CUTANEOUS | Status: DC | PRN

## 2024-09-05 MED ORDER — SENNOSIDES-DOCUSATE SODIUM 8.6-50 MG PO TABS
1.0000 | ORAL_TABLET | Freq: Two times a day (BID) | ORAL | Status: DC
  Administered 2024-09-05: 1 via ORAL
  Filled 2024-09-05 (×4): qty 1

## 2024-09-05 NOTE — Assessment & Plan Note (Addendum)
 Dialysis 11/12 with 1L off. Additional dialysis treatment today per nephro. - Nephrology following, appreciate recs - Dialysis today, patient set up with outpatient dialysis with next appointment scheduled for 11/17; no further dialysis/nephro needs this admission - Labs: AM CBC, RFP - PTH pending - PT/OT eval and treat - Fall precautions - Delirium precautions

## 2024-09-05 NOTE — Progress Notes (Addendum)
 [name] NEPHROLOGY PROGRESS NOTE  Assessment/ Plan: Pt is a 78 y.o. 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  Followed by [name] at [facility] and at the [facility].  He has LUE AV fistula created on 11/20/2023. -First HD on 11/11, cannulated AV fistula well.   -2nd HD yesterday, tolerated well.  We will plan for third treatment today. - Awaiting outpatient HD placement.   # Fluid overload/lower extreme edema: Continue to ultrafiltrate with dialysis, recommend fluid restriction.     # Hypertension: Managing volume with dialysis, holding antihypertensives.   # Anemia of CKD: Patient used to get iron infusion as outpatient.  Iron saturation 15%, serum iron is 20.  I will prescribe IV iron and erythropoietin.  I think the benefit of erythropoietin outweighs the risk.   # CKD-MBD: Pending PTH level.  Monitor calcium  and phosphorus level.  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  Subjective: Seen and examined at bedside.  Tolerated dialysis well yesterday with 1 L UF.  No new event overnight.  Denies nausea, vomiting, chest pain or shortness of breath.  Objective Vital signs in last 24 hours: Vitals:   09/04/24 1936 09/05/24 0045 09/05/24 0359 09/05/24 0729  BP: (!) 162/69 (!) 153/68 (!) 153/59 (!) 160/66  Pulse: 100 85 86 91  Resp: 20 18 17 18   Temp: 98.7 F (37.1 C) 98.1 F (36.7 C) 98.1 F (36.7 C) 98.6 F (37 C)  TempSrc: Oral Oral Oral Oral  SpO2: 91% 91% 93% 91%  Weight:      Height:        Weight change: -3.947 kg  Intake/Output Summary (Last 24 hours) at 09/05/2024 1019 Last data filed at 09/05/2024 0617 Gross per 24 hour  Intake 100 ml  Output 1540 ml  Net -1440 ml       Labs: RENAL PANEL Recent Labs  Lab 09/03/24 0733 09/03/24 2144 09/04/24 1201 09/04/24 1446 09/05/24 0246  NA 139 141 144  --  138  K 5.1 4.3 4.7  --  4.3  CL 109 107 109  --  103  CO2 16* 22 24  --  26  GLUCOSE 102* 111* 90  --  91  BUN 60* 37* 40*  --  25*  CREATININE 7.58* 5.55* 6.08*  --  4.53*  CALCIUM  8.3* 8.1* 8.3*  --  7.9*  MG  --   --   --  2.0  --   PHOS  --  4.0 4.4  --  3.7  ALBUMIN 2.4* 2.3* 2.2*  --  2.1*    Liver Function Tests: Recent Labs  Lab 09/03/24 0733 09/03/24 2144 09/04/24 1201 09/05/24 0246  AST 15  --   --   --   ALT 9  --   --   --   ALKPHOS 47  --   --   --   BILITOT 0.5  --   --   --   PROT 6.7  --   --   --   ALBUMIN 2.4* 2.3* 2.2* 2.1*  No results for input(s): LIPASE, AMYLASE in the last 168 hours. No results for input(s): AMMONIA in the last 168 hours. CBC: Recent Labs    09/03/24 0733 09/03/24 2144 09/04/24 1201 09/04/24 1446 09/05/24 0246  HGB 8.5* 8.8* 8.6*  --  8.1*  MCV 84.8 81.7 84.4  --  82.9  FERRITIN  --   --   --  576*  --   TIBC  --   --   --  132*  --   IRON  --   --   --  20*  --     Cardiac Enzymes: No results for input(s): CKTOTAL, CKMB, CKMBINDEX, TROPONINI in the last 168 hours. CBG: Recent Labs  Lab 09/04/24 0559 09/04/24 1139 09/04/24 1851 09/04/24 2109 09/05/24 0618  GLUCAP 72 86 85 152* 79    Iron Studies:  Recent Labs    09/04/24 1446  IRON 20*  TIBC 132*  FERRITIN 576*   Studies/Results: CT CHEST W CONTRAST Result Date: 09/05/2024 CLINICAL DATA:  Pleural effusion. Malignancy suspected. History of pulmonary nodules. EXAM: CT CHEST WITH CONTRAST TECHNIQUE: Multidetector CT imaging of the chest was performed during intravenous contrast administration. RADIATION DOSE  REDUCTION: This exam was performed according to the departmental dose-optimization program which includes automated exposure control, adjustment of the mA and/or kV according to patient size and/or use of iterative reconstruction technique. CONTRAST:  50mL OMNIPAQUE IOHEXOL 350 MG/ML SOLN COMPARISON:  PET-CT 03/18/2009. FINDINGS: Cardiovascular: The heart is enlarged. Trace pericardial effusion. Coronary artery calcification is evident. Mild atherosclerotic calcification is noted in the wall of the thoracic aorta. Mediastinum/Nodes: 2.1 cm right thyroid nodule. Smaller left thyroid nodule. 11 mm short axis subcarinal lymph node upper normal to borderline enlarged for size. The esophagus has normal imaging features. There is no hilar lymphadenopathy. The esophagus has normal imaging features. Lungs/Pleura: 7.1 x 5.3 cm masslike mixed density lesion identified parahilar left upper lobe. 2.4 x 1.7 cm left lower lobe pulmonary nodule was 2.6 x 2.1 cm on PET-CT 03/18/2009 without hypermetabolism. Dependent collapse/consolidation is seen in the right middle and lower lobes with moderate to large right pleural effusion. No substantial left-sided pleural effusion with minimal dependent atelectasis in the left lower lobe. Upper Abdomen: Small volume free fluid noted adjacent to the liver. 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 Small left renal cyst evident. Musculoskeletal: No worrisome lytic or sclerotic osseous abnormality. IMPRESSION: 1. 7.1 x 5.3 cm masslike mixed density lesion in the parahilar left upper lobe. While pneumonia is a consideration, CT imaging features certainly raise concern for adenocarcinoma. Close follow-up warranted and tissue sampling likely indicated. 2. 2.4 x 1.7 cm left lower lobe pulmonary nodule was 2.6 x 2.1 cm on PET-CT 03/18/2009 without hypermetabolism. Findings most consistent with benign  etiology. 3. Dependent collapse/consolidation in the right middle and lower lobes with moderate to large right pleural effusion. 4. Small volume free fluid adjacent to the liver. 5. 2.1 cm right thyroid nodule. Recommend thyroid US  (ref: J Am Coll Radiol. 2015 Feb;12(2): 143-50). 6.  Aortic Atherosclerosis (ICD10-I70.0). Electronically Signed   By: Camellia Candle M.D.   On: 09/05/2024 05:23   DG CHEST PORT 1 VIEW Result Date: 09/04/2024 CLINICAL DATA:  Dyspnea. EXAM: PORTABLE CHEST 1 VIEW COMPARISON:  09/03/2024 FINDINGS: Similar appearance of the collapse/consolidative opacity in the right lower lung with moderate right pleural effusion. Focal masslike opacity identified parahilar left mid lung without substantial left pleural  effusion. The cardio pericardial silhouette is enlarged. Telemetry leads overlie the chest. IMPRESSION: 1. Similar appearance of the collapse/consolidative opacity in the right lower lung with moderate right pleural effusion. 2. Focal masslike opacity in the parahilar left mid lung. Potentially related to multifocal pneumonia, close follow-up recommended to ensure complete resolution. Electronically Signed   By: Camellia Candle M.D.   On: 09/04/2024 07:08   ECHOCARDIOGRAM COMPLETE Result Date: 09/03/2024    ECHOCARDIOGRAM REPORT   Patient Name:   Ernest Walker Date of Exam: 09/03/2024 Medical Rec #:  [id number]      Height:       69.0 in Accession #:    [id number]     Weight:       180.0 lb Date of Birth:  03/19/1946     BSA:          1.976 m Patient Age:    78 years       BP:           154/75 mmHg Patient Gender: M              HR:           105 bpm. Exam Location:  Inpatient Procedure: 2D Echo, Cardiac Doppler and Color Doppler (Both Spectral and Color            Flow Doppler were utilized during procedure). Indications:    Dyspnea  History:        Patient has no prior history of Echocardiogram examinations.                 Signs/Symptoms:Dyspnea, Shortness of Breath and Bacteremia;  Risk                 Factors:Hypertension, Diabetes, Former Smoker and Dyslipidemia.  Sonographer:    VALENTE, ADAM Referring Phys: CLAUDETTA SILENCE IMPRESSIONS  1. Left ventricular ejection fraction, by estimation, is 50%. The left ventricle has low normal function. The left ventricle demonstrates global hypokinesis. There is mild concentric left ventricular hypertrophy. Left ventricular diastolic parameters are consistent with Grade II diastolic dysfunction (pseudonormalization). Elevated left atrial pressure.  2. Right ventricular systolic function is normal. The right ventricular size is normal. There is normal pulmonary artery systolic pressure. The estimated right ventricular systolic pressure is 32.4 mmHg.  3. Left atrial size was moderately dilated.  4. Right atrial size was mildly dilated.  5. The mitral valve is normal in structure. No evidence of mitral valve regurgitation.  6. Tricuspid valve regurgitation is mild to moderate.  7. The aortic valve is tricuspid. There is mild thickening of the aortic valve. Aortic valve regurgitation is not visualized. Aortic valve sclerosis is present, with no evidence of aortic valve stenosis.  8. The inferior vena cava is dilated in size with >50% respiratory variability, suggesting right atrial pressure of 8 mmHg. FINDINGS  Left Ventricle: Left ventricular ejection fraction, by estimation, is 50%. The left ventricle has low normal function. The left ventricle demonstrates global hypokinesis. The left ventricular internal cavity size was normal in size. There is mild concentric left ventricular hypertrophy. Left ventricular diastolic parameters are consistent with Grade II diastolic dysfunction (pseudonormalization). Elevated left atrial pressure. Right Ventricle: The right ventricular size is normal. No increase in right ventricular wall thickness. Right ventricular systolic function is normal. There is normal pulmonary artery systolic pressure. The tricuspid  regurgitant velocity is 2.47 m/s, and  with an assumed right atrial pressure of 8 mmHg, the estimated right ventricular systolic pressure is  32.4 mmHg. Left Atrium: Left atrial size was moderately dilated. Right Atrium: Right atrial size was mildly dilated. Pericardium: There is no evidence of pericardial effusion. Mitral Valve: The mitral valve is normal in structure. No evidence of mitral valve regurgitation. Tricuspid Valve: The tricuspid valve is normal in structure. Tricuspid valve regurgitation is mild to moderate. Aortic Valve: The aortic valve is tricuspid. There is mild thickening of the aortic valve. Aortic valve regurgitation is not visualized. Aortic valve sclerosis is present, with no evidence of aortic valve stenosis. Pulmonic Valve: The pulmonic valve was grossly normal. Pulmonic valve regurgitation is trivial. No evidence of pulmonic stenosis. Aorta: The aortic root and ascending aorta are structurally normal, with no evidence of dilitation. Venous: The inferior vena cava is dilated in size with greater than 50% respiratory variability, suggesting right atrial pressure of 8 mmHg. IAS/Shunts: No atrial level shunt detected by color flow Doppler.  LEFT VENTRICLE PLAX 2D LVIDd:         4.40 cm   Diastology LVIDs:         3.30 cm   LV e' medial:    6.53 cm/s LV PW:         1.40 cm   LV E/e' medial:  13.1 LV IVS:        1.40 cm   LV e' lateral:   13.40 cm/s LVOT diam:     2.00 cm   LV E/e' lateral: 6.4 LV SV:         72 LV SV Index:   36 LVOT Area:     3.14 cm  RIGHT VENTRICLE             IVC RV Basal diam:  4.10 cm     IVC diam: 1.80 cm RV Mid diam:    3.10 cm RV S prime:     13.10 cm/s TAPSE (M-mode): 2.2 cm LEFT ATRIUM             Index        RIGHT ATRIUM           Index LA diam:        3.90 cm 1.97 cm/m   RA Area:     19.00 cm LA Vol (A2C):   72.8 ml 36.85 ml/m  RA Volume:   60.70 ml  30.73 ml/m LA Vol (A4C):   88.8 ml 44.95 ml/m LA Biplane Vol: 87.7 ml 44.39 ml/m  AORTIC VALVE LVOT Vmax:    129.00 cm/s LVOT Vmean:  84.400 cm/s LVOT VTI:    0.228 m  AORTA Ao Root diam: 2.90 cm MV E velocity: 85.48 cm/s  TRICUSPID VALVE MV A velocity: 76.06 cm/s  TR Peak grad:   24.4 mmHg MV E/A ratio:  1.12        TR Vmax:        247.00 cm/s                             SHUNTS                            Systemic VTI:  0.23 m                            Systemic Diam: 2.00 cm Jerel Croitoru MD Electronically signed by Jerel Balding MD Signature Date/Time: 09/03/2024/5:22:56 PM    Final  Medications: Infusions:    Scheduled Medications:  Chlorhexidine  Gluconate Cloth  6 each Topical Q0600   Chlorhexidine  Gluconate Cloth  6 each Topical Q0600   heparin   5,000 Units Subcutaneous Q8H   hydrocortisone cream   Topical BID   insulin  aspart  0-6 Units Subcutaneous TID WC   polyethylene glycol  17 g Oral Daily   senna-docusate  1 tablet Oral BID   sodium chloride  flush  3 mL Intravenous Q12H    have reviewed scheduled and prn medications.  Physical Exam: General:NAD, comfortable Heart:RRR, s1s2 nl Lungs:clear b/l, no crackle Abdomen:soft, Non-tender, non-distended Extremities: Bilateral lower extremities pitting edema +, gradually improving. Dialysis Access: Left AV fistula has a good thrill.  Ernest Walker 09/05/2024,10:19 AM  LOS: 2 days

## 2024-09-05 NOTE — Assessment & Plan Note (Signed)
-   Pain regimen: Tylenol  1000 mg Q6H PRN - No NSAIDs d/t ERSD - Hydrocortisone cream 1% BID - Bowel regimen: Miralax daily, Senna BID

## 2024-09-05 NOTE — Progress Notes (Addendum)
 Pt has been accepted at [facility], MWF, 10 am chair time, with a tentative start date of Monday the 17th. For the first appt on Monday, he will need to arrive 9 am.   Met bedside with pt to go over these details. Provided and left printed schedule letter with details at bedside with pt. Pt is agreeable and had no questions at this time. Understands that if not discharged over weekend that start date will be pushed back. Will now update AVS and inform care team.  Lavanda Fredrickson Dialysis Navigator [phone]  Addendum 10:52 Care team including MD, nephrologist, SW/CM, and Rn informed of this. AVS updated. Will continue to assist as needed.

## 2024-09-05 NOTE — Consult Note (Signed)
 NAME:  Ernest Walker, MRN:  [id number], DOB:  [birth year], LOS: 2 ADMISSION DATE:  09/03/2024, CONSULTATION DATE:  11/13 REFERRING MD:  Krystal McDiarmid, MD, CHIEF COMPLAINT:  Shortness of breath   History of Present Illness:  Ernest Walker is a 78 y.o. male with PMH of T2DM, ESRD (new this admission; known CKD), HTN, HLD who presented with dyspnea on exertion and bilateral lower extremity edema that had been gradually increasing and was admitted on 11/11 with volume overload. He underwent diuresis on 11/11 and 11/12. Found to have a right pleural effusion on chest x-ray. Follow up CT scan of the chest revealed a lung nodule that was concerning for possible malignancy and pulmonology was consulted for potential biopsy and thoracentesis.  Per chart review, he has a history of a LLL mass dating back to to 2004. This nodule was under surveillance and is smaller on the current CT scan then it was on a PT scan in 2010.  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   There is also a large right sided pleural effusion.  Pertinent  Medical History  TIIDM Newly diagnosed ESRD HTN HLD Thyroid nodule  Significant Hospital Events: Including procedures, antibiotic start and stop dates in addition to other pertinent events   Admitted on 11/11 with volume overload CT scan with mass as above on 11/12 Dialysis 11/11 and 11/12  Interim History / Subjective:  The patient presented to the hospital with dyspnea on exertion.  He has had no pulmonary symptoms before the 3 weeks prior to his hospitalization.  He has also had an associated dry cough over this period of time.  Prior to this current episode, he was able to climb flights of stairs and walk for long periods of time without being short of breath.  He does not have any sick contacts.  He denies fevers, chills, lymph nodes, chest pain,  headaches, abdominal pain, nausea, vomiting, weight loss.   He has a 5-pack-year history of smoking from 1972-1977.  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  He does not know if he was exposed to asbestos in any of the houses that he lived.  He has not ever vaped.  Objective    Blood pressure (!) 162/66, pulse 87, temperature 98 F (36.7 C), temperature source Oral, resp. rate 18, height 5' 9 (1.753 m), weight 77.5 kg, SpO2 92%.        Intake/Output Summary (Last 24 hours) at 09/05/2024 1413 Last data filed at 09/05/2024 0617 Gross per 24 hour  Intake 100 ml  Output 1540 ml  Net -1440 ml   Filed Weights   09/03/24 2000 09/04/24 1504 09/04/24 1802  Weight: 81.1 kg 77.7 kg 77.5 kg    Examination: General: Alert, lying in HD, NAD HENT: MMM. No lymphadenopathy appreciated Lungs: Decreased lung sounds over the right lung. Cardiovascular: RRR Abdomen: Soft, NT, ND Extremities: 3+ pitting edema bilateral lower extremities Neuro: AxO x4  CT chest  performed on 11/12 showed  IMPRESSION: 1. 7.1 x 5.3 cm masslike mixed density lesion in the parahilar left upper lobe. While pneumonia is a consideration, CT imaging features certainly raise concern for adenocarcinoma. Close follow-up warranted and tissue sampling likely indicated. 2. 2.4 x 1.7 cm left lower lobe pulmonary nodule was 2.6 x 2.1 cm on PET-CT 03/18/2009 without hypermetabolism. Findings  most consistent with benign etiology. 3. Dependent collapse/consolidation in the right middle and lower lobes with moderate to large right pleural effusion. 4. Small volume free fluid adjacent to the liver. 5. 2.1 cm right thyroid nodule. Recommend thyroid US  (ref: J Am Coll Radiol. 2015 Feb;12(2): 143-50). 6.  Aortic Atherosclerosis (ICD10-I70.0).  Personal interpretation of the CT chest with Dr. Kassie was remarkable for the presence of air bronchograms within the mass. Otherwise, agree with the  radiologist interpretation.  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 No fevers, chills, weight loss. He has a dry cough around the same time period. While the CT scan is concerning for malignancy, the presence of air brochograms within the mass would be more characteristic of an infectious process. While he does not currently have infectious symptoms, we would favor a trial of antibiotics and an interval CT to assess improvement with a short antibiotic course. If no improvement in imaging, would proceed with navigated biopsy.  -Augmentin  BID 875-125mg  -Follow-up with pulmonology outpatient in one month -Repeat CT chest with contrast prior to outpatient visit -Bronchoscopy guided biopsy if no improvement on CT  Right-sided Pleural Effusion Large pleural effusion noted on CT scan. Likely contributing to his dyspneic on exertion. Was not noted on prior imaging but there is no imaging since a chest x-ray in 2022. On exam, he has decreased lung sounds in his right lung compared to his left. Unclear what the cause of this effusion is but hopefully the pleural fluid studies will elucidate this. Differential includes malignant effusion, HFpEF, or volume overload from newly diagnosed ESRD. As he is currently in HD in will likely not return to the floor until later, we will plan to do this tomorrow.  -Right sided thoracentesis tomorrow -Hold [facility] tomorrow morning, can likely resume after procedure. -OK to continue diet  LLL nodule Stable since prior imaging.  Management of ESRD, HFpEF, and chronic health conditions per [facility] Thank you for this interesting consult, we will continue to follow.   Labs   CBC: Recent Labs  Lab 09/03/24 0733 09/03/24 2144 09/04/24 1201 09/05/24 0246  WBC 7.8 8.4 8.9 9.1  HGB 8.5* 8.8* 8.6* 8.1*  HCT 28.0* 27.7* 27.6* 26.2*  MCV 84.8  81.7 84.4 82.9  PLT 237 231 231 225    Basic Metabolic Panel: Recent Labs  Lab 09/03/24 0733 09/03/24 2144 09/04/24 1201 09/04/24 1446 09/05/24 0246  NA 139 141 144  --  138  K 5.1 4.3 4.7  --  4.3  CL 109 107 109  --  103  CO2 16* 22 24  --  26  GLUCOSE 102* 111* 90  --  91  BUN 60* 37* 40*  --  25*  CREATININE 7.58* 5.55* 6.08*  --  4.53*  CALCIUM  8.3* 8.1* 8.3*  --  7.9*  MG  --   --   --  2.0  --   PHOS  --  4.0 4.4  --  3.7   GFR: Estimated Creatinine Clearance: 13 mL/min (A) (by C-G formula based on SCr of 4.53 mg/dL (H)). Recent Labs  Lab 09/03/24 0733 09/03/24 2144 09/04/24 1201 09/05/24 0246  WBC 7.8 8.4 8.9 9.1    Liver Function Tests: Recent Labs  Lab 09/03/24 0733 09/03/24 2144 09/04/24 1201 09/05/24 0246  AST 15  --   --   --   ALT  9  --   --   --   ALKPHOS 47  --   --   --   BILITOT 0.5  --   --   --   PROT 6.7  --   --   --   ALBUMIN 2.4* 2.3* 2.2* 2.1*   No results for input(s): LIPASE, AMYLASE in the last 168 hours. No results for input(s): AMMONIA in the last 168 hours.  ABG    Component Value Date/Time   TCO2 19 08/18/2015 0907     Coagulation Profile: No results for input(s): INR, PROTIME in the last 168 hours.  Cardiac Enzymes: No results for input(s): CKTOTAL, CKMB, CKMBINDEX, TROPONINI in the last 168 hours.  HbA1C: Hgb A1c MFr Bld  Date/Time Value Ref Range Status  09/04/2024 02:46 PM 6.2 (H) 4.8 - 5.6 % Final    Comment:    (NOTE) Diagnosis of Diabetes The following HbA1c ranges recommended by the American Diabetes Association (ADA) may be used as an aid in the diagnosis of diabetes mellitus.  Hemoglobin             Suggested A1C NGSP%              Diagnosis  <5.7                   Non Diabetic  5.7-6.4                Pre-Diabetic  >6.4                   Diabetic  <7.0                   Glycemic control for                       adults with diabetes.    11/29/2020 05:44 PM 7.7 (H) 4.8 -  5.6 % Final    Comment:    (NOTE) Pre diabetes:          5.7%-6.4%  Diabetes:              >6.4%  Glycemic control for   <7.0% adults with diabetes     CBG: Recent Labs  Lab 09/04/24 1139 09/04/24 1851 09/04/24 2109 09/05/24 0618 09/05/24 1101  GLUCAP 86 85 152* 79 99    Past Medical History:  He,  has a past medical history of Arthritis, Carpal tunnel syndrome of right wrist, Diabetes mellitus without complication (HCC), Hypertension, and Prostate cancer (HCC) (2007).   Surgical History:   Past Surgical History:  Procedure Laterality Date   CARPAL TUNNEL RELEASE Right 08/18/2015   Procedure: RIGHT CARPAL TUNNEL RELEASE;  Surgeon: Arley Curia, MD;  Location: [facility];  Service: Orthopedics;  Laterality: Right;  block in preop   INCISION AND DRAINAGE DEEP NECK ABSCESS     INSERTION PROSTATE RADIATION SEED  2007   at [place] in [facility] NERVE TRANSPOSITION Right 08/18/2015   Procedure:  DECOMPRESSION ULNAR NERVE RIGHT WRIST WITH DEBRIDEMENT ULNAR NERVE;  Surgeon: Arley Curia, MD;  Location: [facility];  Service: Orthopedics;  Laterality: Right;  block in preop     Social History:   reports that he has quit smoking. He has never used smokeless tobacco. He reports that he does not drink alcohol and does not use drugs.   Family History:  His Family history is unknown by patient.   Allergies Allergies  Allergen Reactions   Nsaids Other (See Comments)    Contraindication due to CKD      Home Medications  Prior to Admission medications   Medication Sig Start Date End Date Taking? Authorizing Provider  amLODipine  (NORVASC ) 10 MG tablet Take 10 mg by mouth daily. 04/12/22  Yes [provider]  atorvastatin  (LIPITOR) 80 MG tablet TAKE ONE-HALF TABLET BY MOUTH AT BEDTIME FOR CHOLESTEROL 10/12/21  Yes [provider]  insulin  glargine-yfgn (SEMGLEE) 100 UNIT/ML Pen Inject 10 Units into the skin daily. 10/07/21  Yes [provider]  lisinopril (ZESTRIL) 40 MG tablet TAKE ONE-HALF TABLET BY MOUTH EVERY DAY FOR BLOOD PRESSURE 04/12/22  Yes [provider]  terazosin  (HYTRIN ) 10 MG capsule Take 1 capsule by mouth at bedtime. 04/12/22  Yes [provider]  aspirin 81 MG chewable tablet CHEW ONE TABLET BY MOUTH EVERY DAY ASK YOUR DOCTOR HOW LONG YOU SHOULD TAKE THIS MEDICATION Patient not taking: Reported on 09/03/2024 04/12/22   [provider]  calcitRIOL (ROCALTROL) 0.25 MCG capsule Take 1 capsule by mouth 2 (two) times daily. Patient not taking: Reported on 09/03/2024 04/12/22   [provider]  dorzolamide-timolol (COSOPT) 2-0.5 % ophthalmic solution Apply 1 drop to eye 2 (two) times daily. Patient not taking: Reported on 09/03/2024 05/06/24   [provider]  empagliflozin (JARDIANCE) 25 MG TABS tablet Take 0.5 tablets by mouth daily. Patient not taking: Reported on 09/03/2024 09/29/20   [provider]  ferrous sulfate 325 (65 FE) MG tablet Take 325 mg by mouth See admin instructions. Take one tablet on Monday, Wednesday, and Friday Patient not taking: Reported on 09/03/2024 11/09/20   [provider]  sevelamer carbonate (RENVELA) 800 MG tablet Take 800 mg by mouth 3 (three) times daily with meals. Patient not taking: Reported on 09/03/2024 07/05/24   [provider]  sodium bicarbonate 650 MG tablet Take 1,300 mg by mouth 2 (two) times daily. Patient not taking: Reported on 09/03/2024 07/05/24   [provider]    Melvenia Morrison, PGY-1 Internal Medicine Residency Critical care service

## 2024-09-05 NOTE — Assessment & Plan Note (Addendum)
 Mod to large R pleural effusion seen on CT chest. - Pain regimen: Tylenol  1000 mg q6h PRN - Pulm consulting as above, can consider thoracentesis with them given new mass finding

## 2024-09-05 NOTE — Assessment & Plan Note (Addendum)
 CT Chest with finding of 7.1 x 5.3 cm masslike mixed density lesion in the parahilar left upper lobe concerning for possible adenocarcinoma.  Attempted to call wife this morning to update her on findings, was unable to reach her; per patient wife will be present at bedside after 10 AM and can attempt updates at that time - Consulting pulmonology for biopsy - Update wife as able

## 2024-09-05 NOTE — Discharge Instructions (Signed)
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 Nutrition Therapy  Choosing healthy food, staying physically active and taking medicines as prescribed by your health care provider may help slow down the progression of kidney disease. There is not one eating plan that is right for everyone with kidney disease. Your registered dietitian nutritionist (RDN) will help you identify what's best for you to eat. Why is nutrition important in kidney disease? Your kidneys help keep nutrients and minerals balanced in your body and remove the waste products from your blood. With kidney disease, your kidneys may not be able to do this job very well. You may need to make some changes to your diet. You may need to control the amount of protein, sodium, potassium, phosphorus or calcium in your diet. You will also still need to follow diet recommendations for any other conditions you have, like heart disease or diabetes. Fortunately, these diets are similar. Your nutrition care plan might change over time depending on the status of your condition. Your registered dietitian nutritionist  (RDN) or health care provider will tell you if changes are needed based on your blood test results.   Tips How to plan a kidney-friendly meal Fill a 9-inch or 10-inch plate with: Fruits and/or vegetables Breads, cereals, or grains Protein   A healthy fat Your body needs protein to help build muscle, repair tissue, and fight infection. If you have kidney disease, eating less protein can help protect your kidneys if you are not on dialysis. The most effective way to protect your health is to eat less red meat such as beef or pork and smaller protein portions and to choose plant-proteins as a meat alternative. If you are on dialysis, the protein serving is the size of your palm. If you are not on dialysis, the protein serving is 1/3 to  the size of your palm. Your RDN will discuss how much protein you should eat. Eat at least 6 servings of grain daily  and choose whole grains for at least half those servings. Fruits and vegetables are an important part of a healthy diet and help increase your intake of fiber. Eat at least 5 servings of fresh, frozen, or canned fruits and vegetables daily. These foods are a source of potassium but you only need to limit how much you eat if your potassium level is high. Products labeled as "low sodium" may use potassium chloride in place of sodium. Check the ingredient list to make sure you can safely eat low-sodium foods. You can enjoy ____servings of dairy and dairy alternatives. Your RDN will make a specific recommendation based on your individual needs. Your health care provider will let you know if you need to limit fluid intake. Less fluid will help you manage urine output, and avoid fluid retention which can cause shortness of breath, swelling, high blood pressure, and increased strain on your heart and blood vessels.    Nutrients to Monitor You may need to pay attention to sodium, phosphorus, and potassium in your diet. Your RDN can provide you handouts on potassium and/or phosphorus for more details and strategies to manage these nutrients. Tips to limit sodium: Eat home-cooked meals made from fresh ingredients. Choose foods and condiments with 200 milligrams of sodium or less per serving. Use frozen or packaged meals with 600 milligrams or less sodium per serving if you are too tired to cook. Check labels to avoid foods that have more than 200 milligrams of sodium per serving. These foods may include canned soups or soup  mixes, packaged foods, pickled foods, sauces, and seasonings. Limit how much salt you add to foods or avoid it altogether. Salt-free seasonings like herbs, spices, lemon juice, and vinegar will flavor to your food without adding salt. Ask your RDN which frozen and convenience foods, fast foods, or restaurant meals may be ok for you. If you also have diabetes and/or heart disease: It is easy  to manage these multiple diets because they are similar in many ways. Eat a variety of healthy foods. Choose whole grain foods. Eat a moderate amount of protein and choose low-fat, lean, and heart-healthy options. Eat at least 5 servings/day of fruits and vegetables. Your blood potassium level will affect which fruits and vegetables you can safely eat. Eat less food with added salt, sugars, and fats.  Your RDN can provide you with additional recommendations if necessary.    Foods to Choose or Limit Your RDN will tell you if you need to limit phosphorus or potassium in your diet and provide you separate handouts about foods to choose or limit. Food Group Choose Limit  Grains Whole grain cereal Oats, oatmeal Whole wheat bread, pita English muffin Corn tortillas Whole wheat pasta Brown rice Quinoa Couscous Grits Popcorn Rice cakes Whole wheat crackers   Grains with more than 200 milligrams of sodium per serving Boxed biscuit, cake, pancake/waffle mixes and other convenience foods Snacks and sweets should be eaten in moderation    Protein Foods Eggs or egg whites Lean beef, wild game, and "all natural" chicken, fish, pork, seafood, or [place] Legumes/Pulses: Beans (such as black, kidney, or white beans), lentils, split peas, black-eyed peas Soy: Tofu, edamame Nuts and nut butters Protein with more than 200 mg sodium per serving Processed or frozen protein foods   Salty processed meats (such as bacon, bologna, salami and other lunch meats),  ham, hot dogs,  sausage, breakfast sausage, and pre-seasoned meats    Dairy and Dairy Alternatives Lower-phosphorus milk alternatives include unfortified almond, rice, soy or other plant beverages Lower-phosphorus cheeses include brie, goat, cream cheese, mozzarella, parmesan, or ricotta cheese Processed cheeses, such as American cheese, cheese spreads, boxed macaroni and cheese Milk-based or cheese-based soups or sauces Nondairy creamers   Vegetables Fresh, frozen, or no-salt added canned vegetables Processed vegetables or vegetable juice with more than 200 milligrams sodium per serving. Pickled foods, such as olives, sauerkraut, pickles, kimchi Vegetables with added sauces  Fruit Fresh, frozen, or canned fruit Canned fruit in syrup or with added sugars  Fats and Oils Healthy fats such as olive oil, vegetable oil or lower sodium salad dressings Butter, margarine, mayonnaise, and sour cream in moderation Dressing, condiments and other sauces with more than 200 mg of sodium per serving  Beverages/ Fluids   (Fluids include anything that is liquid at room temperature. You may need to limit how much you drink if you are producing less urine.)   Water Coffee Tea Lemonade Seltzer Processed beverages (such as most colas, sports drinks, energy drinks, some flavored waters, drink mixes., some bottled teas and others) Canned soups with more than 200 mg sodium per serving Beer and wine    Other Herbs, spices, lemon juice, vinegars to flavor food instead of salt Stocks or broths labeled as "no salt added" Condiments and sauces with less than 200 mg sodium per serving Salt, and salt substitutes Bouillon and broths with more than 200 milligrams per serving Broths and soups labeled as "low sodium" Condiments and sauces with more than 200 mg sodium per serving  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 (Not on Dialysis) Sample 1-Day Menu View Nutrient Info Breakfast 1 egg, hard-boiled 1 cup oatmeal 1 cup blueberries 1 cup coffee  Lunch Sandwich made with: 2 slices whole wheat bread 1 ounce [place], sliced 2 leaves lettuce 1 teaspoon mustard 1 tablespoon mayonnaise 1 cup carrots, raw 1 apple 1 cup water with lemon  Afternoon Snack 2 tablespoons hummus 4 celery sticks  Evening Meal 2 ounces fish, broiled 1 cup brown rice, cooked  cup green peppers, sauted  cup mushrooms, sauted 2 tablespoons olive oil  cup green beans  cup  peaches  Evening Snack 3 cups popcorn, air-popped 1 pear  Daily Sum Nutrient Unit Value  Macronutrients  Energy kcal 1611  Energy kJ 6744  Protein g 57  Total lipid (fat) g 59  Carbohydrate, by difference g 226  Fiber, total dietary g 38  Sugars, total g 70  Minerals  Calcium, Ca mg 354  Iron, Fe mg 10  Sodium, Na mg 835  Vitamins  Vitamin C, total ascorbic acid mg 77  Vitamin A, IU IU 22712  Vitamin D IU 74  Lipids  Fatty acids, total saturated g 10  Fatty acids, total monounsaturated g 29  Fatty acids, total polyunsaturated g 16  Cholesterol mg 239     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 Vegan Sample 1-Day Menu View Nutrient Info Breakfast 1 cup cooked oatmeal 1 slice whole wheat toast 2 teaspoons margarine, soft, tub  cup blueberries 1 cup soy milk 1 cup coffee  Lunch 1 whole wheat pita bread  cup hummus 1 large apple  cup sliced cucumber  Evening Meal Stir-fry made with:  cup tofu  cup mung beans 1 cup rice  cup cabbage  cup carrots 1 tablespoon peanut oil  cup sliced peaches 1 cup iced tea  Evening Snack 1 cup soy yogurt 1 cup air-popped popcorn 1 tablespoon olive oil  Daily Sum Nutrient Unit Value  Macronutrients  Energy kcal 1730  Energy kJ 7234  Protein g 58  Total lipid (fat) g 63  Carbohydrate, by difference g 249  Fiber, total dietary g 30  Sugars, total g 77  Minerals  Calcium, Ca mg 900  Iron, Fe mg 13  Sodium, Na mg 1052  Vitamins  Vitamin C, total ascorbic acid mg 75  Vitamin A, IU IU 10060  Vitamin D IU 120  Lipids  Fatty acids, total saturated g 11  Fatty acids, total monounsaturated g 26  Fatty acids, total polyunsaturated g 18  Cholesterol mg 0     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 Vegetarian (Lacto-Ovo) Sample 1-Day Menu View Nutrient Info Breakfast 1 cup cooked oatmeal 1 slice whole wheat toast 2 teaspoons margarine, soft, tub 1 scrambled egg  cup blueberries  cup soy milk 1 cup coffee  Lunch 2 slices whole  wheat bread 2 tablespoons peanut butter 1 cup lettuce  cup sliced cucumber 1 tablespoon oil and vinegar salad dressing 1 large apple  Evening Meal Stir-fry made with:  cup tofu 1 cup rice  cup canned bamboo shoots  cup carrots 1 tablespoon peanut oil  cup sliced peaches 1 cup iced tea  Evening Snack 1 cup air-popped popcorn 1 tablespoon olive oil  Daily Sum Nutrient Unit Value  Macronutrients  Energy kcal 1693  Energy kJ 7076  Protein g 54  Total lipid (fat) g 81  Carbohydrate, by difference g 201  Fiber, total dietary g 27  Sugars, total g 61  Minerals  Calcium, Ca  mg 585  Iron, Fe mg 14  Sodium, Na mg 742  Vitamins  Vitamin C, total ascorbic acid mg 32  Vitamin A, IU IU 12180  Vitamin D IU 44  Lipids  Fatty acids, total saturated g 16  Fatty acids, total monounsaturated g 36  Fatty acids, total polyunsaturated g 24  Cholesterol mg 169     Chronic Kidney Disease Stage 5 (on Dialysis) Sample 1-Day Menu View Nutrient Info Breakfast 1 egg, hard-boiled 1 cup oatmeal  cup blueberries  cup soy yogurt 1 cup coffee  Lunch Sandwich made with: 2 slices whole wheat bread 2 ounces [place], sliced 2 leaves lettuce 1 teaspoon mustard 1 tablespoon mayonnaise 1 cup carrots, raw 1 apple 1 cup water with lemon  Afternoon Snack 2 tablespoons peanut butter 4 celery sticks  Evening Meal 3 ounces fish, broiled  cup brown rice, cooked  cup green peppers, sauted  cup mushrooms, sauted 1 tablespoon olive oil  cup green beans  cup peaches  Evening Snack 3 cups popcorn, air-popped topped with: 1 teaspoon parmesan cheese 1 pear  Daily Sum Nutrient Unit Value  Macronutrients  Energy kcal 1713  Energy kJ 7170  Protein g 79  Total lipid (fat) g 64  Carbohydrate, by difference g 220  Fiber, total dietary g 36  Sugars, total g 84  Minerals  Calcium, Ca mg 664  Iron, Fe mg 11  Sodium, Na mg 835  Vitamins  Vitamin C, total ascorbic acid mg 100  Vitamin A,  IU IU 22694  Vitamin D IU 91  Lipids  Fatty acids, total saturated g 12  Fatty acids, total monounsaturated g 27  Fatty acids, total polyunsaturated g 18  Cholesterol mg 278    Copyright 2020  [employer]. All rights reserved     [facility]  Discharge Instructions  AV Fistula or Graft Surgery for Dialysis Access  Please refer to the following instructions for your post-procedure care. Your surgeon or physician assistant will discuss any changes with you.  Activity  You may drive the day following your surgery, if you are comfortable and no longer taking prescription pain medication. Resume full activity as the soreness in your incision resolves.  Bathing/Showering  You may shower after you go home. Keep your incision dry for 48 hours. Do not soak in a bathtub, hot tub, or swim until the incision heals completely. You may not shower if you have a hemodialysis catheter.  Incision Care  Clean your incision with mild soap and water after 48 hours. Pat the area dry with a clean towel. You do not need a bandage unless otherwise instructed. Do not apply any ointments or creams to your incision. You may have skin glue on your incision. Do not peel it off. It will come off on its own in about one week. Your arm may swell a bit after surgery. To reduce swelling use pillows to elevate your arm so it is above your heart. Your doctor will tell you if you need to lightly wrap your arm with an ACE bandage.  Diet  Resume your normal diet. There are not special food restrictions following this procedure. In order to heal from your surgery, it is CRITICAL to get adequate nutrition. Your body requires vitamins, minerals, and protein. Vegetables are the best source of vitamins and minerals. Vegetables also provide the perfect balance of protein. Processed food has little nutritional value, so try to avoid this.  Medications  Resume taking all  of your medications. If your incision is causing pain, you may take over-the counter pain relievers such as acetaminophen (Tylenol). If you were prescribed a stronger pain medication, please be aware these medications can cause nausea and constipation. Prevent nausea by taking the medication with a snack or meal. Avoid constipation by drinking plenty of fluids and eating foods with high amount of fiber, such as fruits, vegetables, and grains.  Do not take Tylenol if you are taking prescription pain medications.  Follow up Your surgeon may want to see you in the office following your access surgery. If so, this will be arranged at the time of your surgery.  Please call us immediately for any of the following conditions:  Increased pain, redness, drainage (pus) from your incision site Fever of 101 degrees or higher Severe or worsening pain at your incision site Hand pain or numbness.  Reduce your risk of vascular disease:  Stop smoking. If you would like help, call [facility] at 1-800-QUIT-NOW (289)399-1272) or [place] at [place] your cholesterol Maintain a desired weight Control your diabetes Keep your blood pressure down  Dialysis  It will take several weeks to several months for your new dialysis access to be ready for use. Your surgeon will determine when it is okay to use it. Your nephrologist will continue to direct your dialysis. You can continue to use your Permcath until your new access is ready for use.   12/08/2022 TIRSA SABIR [id number] May 06, 1982  Surgeon(s): Waynetta Sandy, MD  Procedure(s): Creation left brachiocephalic AV fistula  x Do not stick fistula for 12 weeks    If you have any questions, please call the office at [phone].

## 2024-09-05 NOTE — Plan of Care (Signed)
  Problem: Education: Goal: Knowledge of General Education information will improve Description: Including pain rating scale, medication(s)/side effects and non-pharmacologic comfort measures Outcome: Progressing   Problem: Fluid Volume: Goal: Ability to maintain a balanced intake and output will improve Outcome: Progressing

## 2024-09-05 NOTE — Progress Notes (Signed)
 Initial Nutrition Assessment  DOCUMENTATION CODES:   Not applicable  INTERVENTION:  Provide Nepro BID. Each supplement provides 425 Kcals and 19 grams protein. Handout Nutrition for Dialysis from [employer] left at patient's bedside with his son. Diet handout also provided on AVS. Continue renal diet with 1.2 L fluid restriction.   NUTRITION DIAGNOSIS:   Increased nutrient needs related to chronic illness as evidenced by estimated needs.   GOAL:   Patient will meet greater than or equal to 90% of their needs   MONITOR:   PO intake, Supplement acceptance, Weight trends, Labs  REASON FOR ASSESSMENT:   Consult Assessment of nutrition requirement/status, Diet education  ASSESSMENT:   Patient presented with shortness of breath, lower extremity edema, weakness and was found to have new ESRD with fluid overload, pleural effusion/lung nodule concern for malignancy, and thyroid nodule. PMH significant for DM2, CKD5, HFpEF, HTN, dyslipidemia, prostate cancer s/p radiation 2007.  11/11 admitted, HD initiated 11/12 second HD  Attempted to visit the patient X 2. He was not in room but his son was in room who was unable to provide any history. Left diet handout bedside. Then attempted to visit patient in dialysis room but he had 3 providers at bedside. Left diet handout in AVS as well.  Scheduled Meds:  Chlorhexidine  Gluconate Cloth  6 each Topical Q0600   darbepoetin (ARANESP) injection - DIALYSIS  60 mcg Subcutaneous Q Thu-1800   heparin   5,000 Units Subcutaneous Q8H   hydrocortisone cream   Topical BID   insulin  aspart  0-6 Units Subcutaneous TID WC   polyethylene glycol  17 g Oral Daily   senna-docusate  1 tablet Oral BID   sodium chloride  flush  3 mL Intravenous Q12H   Continuous Infusions:  anticoagulant sodium citrate     iron sucrose 200 mg (09/05/24 1251)   PRN Meds:.acetaminophen , alteplase, anticoagulant sodium citrate, heparin , lidocaine   (PF), lidocaine -prilocaine, pentafluoroprop-tetrafluoroeth  Diet Order             Diet renal with fluid restriction Fluid restriction: 1200 mL Fluid; Room service appropriate? Yes; Fluid consistency: Thin  Diet effective now                  Meal Intake: 2 meals charted, 75% and most recently today 10%  Labs:     Latest Ref Rng & Units 09/05/2024    2:46 AM 09/04/2024   12:01 PM 09/03/2024    9:44 PM  CMP  Glucose 70 - 99 mg/dL 91  90  888   BUN 8 - 23 mg/dL 25  40  37   Creatinine 0.61 - 1.24 mg/dL 5.46  3.91  4.44   Sodium 135 - 145 mmol/L 138  144  141   Potassium 3.5 - 5.1 mmol/L 4.3  4.7  4.3   Chloride 98 - 111 mmol/L 103  109  107   CO2 22 - 32 mmol/L 26  24  22    Calcium  8.9 - 10.3 mg/dL 7.9  8.3  8.1     I/O: -2 L since admit  NUTRITION - FOCUSED PHYSICAL EXAM:  Unable to perform  EDUCATION NEEDS:   Education needs have been addressed  Skin:  Skin Assessment: Reviewed RN Assessment  Last BM:  11/12  Height:   Ht Readings from Last 1 Encounters:  09/03/24 5' 9 (1.753 m)    Weight:    Weight Change: 2 Kg (2.5%) loss in 6 months  Usual Body Weight: unable  to obtain  Edema: +3 BLE, non-pitting generalized  Ideal Body Weight:  66 kg   BMI:  Body mass index is 25.2 kg/m.  Estimated Nutritional Needs:  Kcal:  1900-2100 Protein:  115-130 Fluid:  >1900    Leverne Ruth, MS, RDN, LDN [place]. [facility] See [facility] for contact information

## 2024-09-05 NOTE — Assessment & Plan Note (Addendum)
 Anemia: Hgb of 8.1 today, stable. Iron 20, suspect IDA. Supplementing with iron. HLD: Hold home atorvastatin  40 mg at bedtime HTN: Holding home amlodipine  10 mg daily, lisinopril 20 mg daily (lisinopril not filled since 11/2023) T2DM: A1c of 6.2%. Holding home insulin  glargine 10u daily; on vsSSI currently during admission

## 2024-09-05 NOTE — Progress Notes (Signed)
 Mobility Specialist Progress Note:    09/05/24 1018  Mobility  Activity Ambulated with assistance  Level of Assistance Contact guard assist, steadying assist  Assistive Device Cane  Distance Ambulated (ft) 200 ft  Range of Motion/Exercises Active  Activity Response Tolerated fair (Some LOB's)  Mobility Referral Yes  Mobility visit 1 Mobility  Mobility Specialist Start Time (ACUTE ONLY) 1018  Mobility Specialist Stop Time (ACUTE ONLY) 1038  Mobility Specialist Time Calculation (min) (ACUTE ONLY) 20 min   Received pt laying in bed agreeable to session. No c/o any symptoms but pt has several instances of losing balance but able to correct. Pt moving and ambulating decently otherwise. Returned pt to bed w/ all needs met.  Venetia Keel Mobility Specialist Please Neurosurgeon or Rehab Office at [phone]

## 2024-09-05 NOTE — Plan of Care (Signed)
 Went to see patient and his wife at bedside to explain CT chest findings.  Explained finding of lung mass that is concerning for malignancy, and that we have consulted pulmonology for further workup and likely biopsy of this mass; explained to patient and wife that I am unsure if this will be able to happen while he is in the hospital, though this is what patient and wife are hopeful for.  Also explained finding of thyroid nodule, and that we will be doing a thyroid ultrasound to investigate this further.  All questions answered, and patient and wife expressed understanding of current plan.

## 2024-09-05 NOTE — Plan of Care (Signed)
  Problem: Education: Goal: Ability to describe self-care measures that may prevent or decrease complications (Diabetes Survival Skills Education) will improve Outcome: Progressing   Problem: Coping: Goal: Ability to adjust to condition or change in health will improve Outcome: Progressing   Problem: Fluid Volume: Goal: Ability to maintain a balanced intake and output will improve Outcome: Progressing   Problem: Skin Integrity: Goal: Risk for impaired skin integrity will decrease Outcome: Progressing   Problem: Clinical Measurements: Goal: Ability to maintain clinical measurements within normal limits will improve Outcome: Progressing   Problem: Clinical Measurements: Goal: Will remain free from infection Outcome: Progressing   Problem: Nutritional: Goal: Ability to make healthy dietary choices will improve Outcome: Progressing

## 2024-09-05 NOTE — Progress Notes (Signed)
 Daily Progress Note Intern Pager: [phone]  Patient name: Ernest Walker Medical record number: [id number] Date of birth: 03/06/1946 Age: 78 y.o. Gender: male  Primary Care Provider: [facility] Consultants: Nephrology [facility]); pulm Code Status: Full  Pt Overview and Major Events to Date:  - 11/11: Admitted, nephro consulted  - 11/13: Pulm consulted for lung biopsy  Medical Decision Making:  Ernest Walker is a 78 y.o. male with PMH of T2DM, ESRD (new this admission; known CKD), HTN, HLD. Admitted for hypervolemia in setting of newly diagnosed ESRD and with possible contribution from HFpEF. Had persistent right pleural effusion and CT chest was done, which showed continued right pleural effusion; did reveal a lung nodule concerning for possible malignancy, will investigate further with biopsy. CT chest also showed a thyroid nodule, which will investigate further; reassuringly, TSH normal on recent check 11/11.  Assessment & Plan Hypervolemia associated with renal insufficiency ESRD (end stage renal disease) (HCC) SOB (shortness of breath) (HFpEF) heart failure with preserved ejection fraction (HCC) Dialysis 11/12 with 1L off. Additional dialysis treatment today per nephro. - Nephrology following, appreciate recs - Dialysis today, patient set up with outpatient dialysis with next appointment scheduled for 11/17; no further dialysis/nephro needs this admission - Labs: AM CBC, RFP - PTH pending - PT/OT eval and treat - Fall precautions - Delirium precautions Lung nodule CT Chest with finding of 7.1 x 5.3 cm masslike mixed density lesion in the parahilar left upper lobe concerning for possible adenocarcinoma.  Attempted to call wife this morning to update her on findings, was unable to reach her; per patient wife will be present at bedside after 10 AM and can attempt updates at that time - Consulting pulmonology for biopsy - Update wife as able Pleural effusion,  right Mod to large R pleural effusion seen on CT chest. - Pain regimen: Tylenol  1000 mg q6h PRN - Pulm consulting as above, can consider thoracentesis with them given new mass finding Thyroid nodule 2.1 cm right thyroid nodule seen on CT chest. TSH of 2.268. - Will get thyroid US  to investigate further. Hemorrhoid - Pain regimen: Tylenol  1000 mg Q6H PRN - No NSAIDs d/t ERSD - Hydrocortisone cream 1% BID - Bowel regimen: Miralax daily, Senna BID Chronic health problem Anemia: Hgb of 8.1 today, stable. Iron 20, suspect IDA. Supplementing with iron. HLD: Hold home atorvastatin  40 mg at bedtime HTN: Holding home amlodipine  10 mg daily, lisinopril 20 mg daily (lisinopril not filled since 11/2023) T2DM: A1c of 6.2%. Holding home insulin  glargine 10u daily; on vsSSI currently during admission   FEN/GI: Renal diet with 1200 mL fluid restriction  PPx: Heparin  TID Dispo:Home with home health pending clinical improvement . Barriers include continued diuresis/dialysis needs, further workup of lung nodule.   Subjective:  Patient reports his breathing remains improved overall, and believes his leg edema is improving as well.  No other concerns this morning.  Explained CT chest findings to patient today, patient reports understanding and received news well.  Requested that his wife be notified of findings.  Objective: Temp:  [97.9 F (36.6 C)-99.1 F (37.3 C)] 98.6 F (37 C) (11/13 0729) Pulse Rate:  [79-122] 91 (11/13 0729) Resp:  [17-25] 18 (11/13 0729) BP: (141-162)/(58-75) 160/66 (11/13 0729) SpO2:  [88 %-97 %] 91 % (11/13 0729) Weight:  [77.5 kg-77.7 kg] 77.5 kg (11/12 1802) Physical Exam: General: Patient sitting up in bed watching TV, no acute distress. Cardiovascular: Regular rate and rhythm, no murmurs/rubs/gallops.  Respiratory: Normal work of breathing on room air. Clear to auscultation bilaterally; no wheezes, crackles. Abdomen: Bowel sounds present and normoactive bilaterally.  Soft, nondistended, nontender. Extremities: Skin warm, dry.  1+ left lower extremity, 1-2+ right lower extremity pitting edema  Laboratory: Most recent CBC Lab Results  Component Value Date   WBC 9.1 09/05/2024   HGB 8.1 (L) 09/05/2024   HCT 26.2 (L) 09/05/2024   MCV 82.9 09/05/2024   PLT 225 09/05/2024   Most recent BMP    Latest Ref Rng & Units 09/05/2024    2:46 AM  BMP  Glucose 70 - 99 mg/dL 91   BUN 8 - 23 mg/dL 25   Creatinine 9.38 - 1.24 mg/dL 5.46   Sodium 864 - 854 mmol/L 138   Potassium 3.5 - 5.1 mmol/L 4.3   Chloride 98 - 111 mmol/L 103   CO2 22 - 32 mmol/L 26   Calcium  8.9 - 10.3 mg/dL 7.9   Mag: 2.0 Ferritin 576  Iron 20, TIBC 132 A1c 6.2% TSH: 2.268  Imaging/Diagnostic Tests: CT Chest 11/13: 1. 7.1 x 5.3 cm masslike mixed density lesion in the parahilar left upper lobe. While pneumonia is a consideration, CT imaging features certainly raise concern for adenocarcinoma. Close follow-up warranted and tissue sampling likely indicated.  2. 2.4 x 1.7 cm left lower lobe pulmonary nodule was 2.6 x 2.1 cm on PET-CT 03/18/2009 without hypermetabolism. Findings most consistent with benign etiology. 3. Dependent collapse/consolidation in the right middle and lower lobes with moderate to large right pleural effusion. 4. Small volume free fluid adjacent to the liver. 5. 2.1 cm right thyroid nodule. Recommend thyroid US  (ref: J Am Coll Radiol. 2015 Feb;12(2): 143-50). 6.  Aortic Atherosclerosis (ICD10-I70.0).  Larraine Palma, MD 09/05/2024, 7:47 AM  PGY-1, [facility] FPTS Intern pager: [phone], text pages welcome Secure chat group [facility] Teaching Service

## 2024-09-05 NOTE — Assessment & Plan Note (Signed)
 2.1 cm right thyroid nodule seen on CT chest. TSH of 2.268. - Will get thyroid US  to investigate further.

## 2024-09-06 ENCOUNTER — Telehealth: Payer: Self-pay | Admitting: Pulmonary Disease

## 2024-09-06 ENCOUNTER — Inpatient Hospital Stay (HOSPITAL_COMMUNITY)

## 2024-09-06 DIAGNOSIS — R918 Other nonspecific abnormal finding of lung field: Secondary | ICD-10-CM

## 2024-09-06 DIAGNOSIS — N186 End stage renal disease: Secondary | ICD-10-CM | POA: Diagnosis not present

## 2024-09-06 DIAGNOSIS — R911 Solitary pulmonary nodule: Secondary | ICD-10-CM | POA: Diagnosis not present

## 2024-09-06 DIAGNOSIS — J90 Pleural effusion, not elsewhere classified: Secondary | ICD-10-CM | POA: Diagnosis not present

## 2024-09-06 LAB — RENAL FUNCTION PANEL
Albumin: 2 g/dL — ABNORMAL LOW (ref 3.5–5.0)
Anion gap: 10 (ref 5–15)
BUN: 13 mg/dL (ref 8–23)
CO2: 27 mmol/L (ref 22–32)
Calcium: 7.8 mg/dL — ABNORMAL LOW (ref 8.9–10.3)
Chloride: 100 mmol/L (ref 98–111)
Creatinine, Ser: 3.41 mg/dL — ABNORMAL HIGH (ref 0.61–1.24)
GFR, Estimated: 17 mL/min — ABNORMAL LOW (ref >60)
Glucose, Bld: 101 mg/dL — ABNORMAL HIGH (ref 70–99)
Phosphorus: 2.8 mg/dL (ref 2.5–4.6)
Potassium: 3.9 mmol/L (ref 3.5–5.1)
Sodium: 137 mmol/L (ref 135–145)

## 2024-09-06 LAB — ALBUMIN, PLEURAL OR PERITONEAL FLUID: Albumin, Fluid: <1.5 g/dL

## 2024-09-06 LAB — GLUCOSE, CAPILLARY
Glucose-Capillary: 84 mg/dL (ref 70–99)
Glucose-Capillary: 112 mg/dL — ABNORMAL HIGH (ref 70–99)
Glucose-Capillary: 101 mg/dL — ABNORMAL HIGH (ref 70–99)

## 2024-09-06 LAB — PROTEIN, TOTAL: Total Protein: 6.6 g/dL (ref 6.5–8.1)

## 2024-09-06 LAB — HEPATITIS B SURFACE ANTIBODY, QUANTITATIVE: Hep B S AB Quant (Post): <3.5 m[IU]/mL — ABNORMAL LOW

## 2024-09-06 LAB — CBC
HCT: 29.2 % — ABNORMAL LOW (ref 39.0–52.0)
Hemoglobin: 9.2 g/dL — ABNORMAL LOW (ref 13.0–17.0)
MCH: 26.2 pg (ref 26.0–34.0)
MCHC: 31.5 g/dL (ref 30.0–36.0)
MCV: 83.2 fL (ref 80.0–100.0)
Platelets: 220 K/uL (ref 150–400)
RBC: 3.51 MIL/uL — ABNORMAL LOW (ref 4.22–5.81)
RDW: 15.8 % — ABNORMAL HIGH (ref 11.5–15.5)
WBC: 8.8 K/uL (ref 4.0–10.5)
nRBC: 0 % (ref 0.0–0.2)

## 2024-09-06 LAB — LACTATE DEHYDROGENASE, PLEURAL OR PERITONEAL FLUID: LD, Fluid: 106 U/L — ABNORMAL HIGH (ref 3–23)

## 2024-09-06 LAB — BODY FLUID CELL COUNT WITH DIFFERENTIAL
Eos, Fluid: 0 %
Lymphs, Fluid: 93 %
Monocyte-Macrophage-Serous Fluid: 4 % — ABNORMAL LOW (ref 50–90)
Neutrophil Count, Fluid: 3 % (ref 0–25)
Total Nucleated Cell Count, Fluid: 1095 uL — ABNORMAL HIGH (ref 0–1000)

## 2024-09-06 LAB — PROTEIN, PLEURAL OR PERITONEAL FLUID: Total protein, fluid: <3 g/dL

## 2024-09-06 LAB — GLUCOSE, PLEURAL OR PERITONEAL FLUID: Glucose, Fluid: 86 mg/dL

## 2024-09-06 LAB — LACTATE DEHYDROGENASE: LDH: 248 U/L — ABNORMAL HIGH (ref 105–235)

## 2024-09-06 LAB — ALBUMIN: Albumin: 2.1 g/dL — ABNORMAL LOW (ref 3.5–5.0)

## 2024-09-06 NOTE — Progress Notes (Signed)
 New Dialysis Start    Patient identified as new dialysis start. Kidney Education packet assembled and given. Discussed the following items with patient:     Current medications and possible changes once started:  Discussed that patient's medications may change over time.  Ex; hypertension medications and diabetes medication.  Nephrologists will adjust as needed.   Fluid restrictions reviewed:  32 oz daily goal:  All liquids count; soups, ice, jello, fruits. Will also refer dietitian.   Phosphorus and potassium: Handout given showing high potassium and phosphorus foods.  Alternative food and drink options given. Will also refer dietitian.   Family support:  None at bedside.SABRASABRAWife is a support at home.   Outpatient Clinic Resources:  Discussed roles of Outpatient clinic staff and advised to make a list of needs, if any, to talk with outpatient staff if needed.   Care plan schedule: Informed patient of Care Plans in outpatient setting and to participate in the care plan.  An invitation would be given from outpatient clinic.    Dialysis Access Options:  Reviewed access options with patient. Discussed in detail about care at home with new AVG & AVF. Reviewed checking bruit and thrill. If dialysis catheter present, educated that patient could not take showers.  Catheter dressing changes were to be done by outpatient clinic staff only.     Very pleasant conversation was held with patient, all questions answered and information on access and hemodialysis added to AVS. Patient verbalized understanding. Will continue to round on patient during admission.    Alfonso Heckler Dialysis Nurse Coordinator [phone] Secure Chat Available

## 2024-09-06 NOTE — Assessment & Plan Note (Addendum)
-   Thoracentesis today with pulm - Post-procedure CXR - Pain regimen: Tylenol  1000 mg q6h PRN
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
## 2024-09-06 NOTE — Procedures (Signed)
 Thoracentesis  Procedure Note  MANFORD SPRONG  [id number]  1946/04/22  Date:09/06/24  Upfz:87:75 PM   Provider Performing:Nate Napoleon, MD and Ilyas Lipsitz Slater Staff, MD  Procedure: Thoracentesis with imaging guidance ([id number])  Indication(s) Pleural Effusion  Consent Risks of the procedure as well as the alternatives and risks of each were explained to the patient and/or caregiver.  Consent for the procedure was obtained and is signed in the bedside chart  Anesthesia Topical only with 1% lidocaine   6 cc   Time Out Verified patient identification, verified procedure, site/side was marked, verified correct patient position, special equipment/implants available, medications/allergies/relevant history reviewed, required imaging and test results available.   Sterile Technique Maximal sterile technique including full sterile barrier drape, hand hygiene, sterile gown, sterile gloves, mask, hair covering, sterile ultrasound probe cover (if used).  Procedure Description Ultrasound was used to identify appropriate pleural anatomy for placement and overlying skin marked.  Area of drainage cleaned and draped in sterile fashion. Lidocaine  was used to anesthetize the skin and subcutaneous tissue.  1000 cc's of cloudy pink-red tinged appearing fluid was drained from the right pleural space. Catheter then removed and bandaid applied to site.   Complications/Tolerance None; patient tolerated the procedure well. Chest X-ray is ordered to confirm no post-procedural complication.   EBL Minimal   Specimen(s) Pleural fluid

## 2024-09-06 NOTE — Progress Notes (Signed)
 Mobility Specialist Progress Note:    09/06/24 1600  Mobility  Activity Turned to stomach - prone (Ankle Pump,Leg Lifts, Heel Slides)  Level of Assistance Standby assist, set-up cues, supervision of patient - no hands on  Assistive Device None  Range of Motion/Exercises Active  Activity Response Tolerated well  Mobility Referral Yes  Mobility visit 1 Mobility  Mobility Specialist Start Time (ACUTE ONLY) 1600  Mobility Specialist Stop Time (ACUTE ONLY) 1636  Mobility Specialist Time Calculation (min) (ACUTE ONLY) 36 min   Received pt laying in bed agreeable to session w/ bed mobility but no ambulation d/t working w/ PT and OT. No c/o any symptoms except some slight pain from centesis site. Pt able to move and perform movements well. Left pt in bed w/ all needs met.  Venetia Keel Mobility Specialist Please Neurosurgeon or Rehab Office at [phone]

## 2024-09-06 NOTE — Assessment & Plan Note (Addendum)
 Dialysis 11/13 with 1.5 L out. No plan for further dialysis inpatient per nephro. - Nephrology has signed off - Outpatient dialysis set up MWF with first session on Mon 11/17 - Labs: Holiday - Feeding supplement BID between meals per RD - Bowel regiment: Miralax daily, Senna BID - PT/OT eval and treat - Fall precautions - Delirium precautions

## 2024-09-06 NOTE — Assessment & Plan Note (Addendum)
 CT Chest with finding of 7.1 x 5.3 cm masslike mixed density lesion in the parahilar left upper lobe concerning for possible adenocarcinoma. - Augmentin  500-125 mg daily started for possible infectious source (11/13- ) for 10 days (per pulm) - Plan per pulm for imaging and outpatient follow-up in 1 month

## 2024-09-06 NOTE — Assessment & Plan Note (Addendum)
 Anemia: Hgb stable at 9.2. Found to have low iron of 20; supplementing with IV iron. HLD: Hold home atorvastatin  40 mg at bedtime HTN: Holding home amlodipine  10 mg daily, lisinopril 20 mg daily (lisinopril not filled since 11/2023) T2DM: A1c of 6.2%. Holding home insulin  glargine 10u daily, plan to hold at discharge; will stop checking CBGs and discontinue sSSI during admission

## 2024-09-06 NOTE — Plan of Care (Signed)
 Went to see patient post thoracentesis.  Got around 1 L off with thoracentesis.  He reports he is doing well with intermittent pain, but overall pain is controlled.  About to work with OT.  Physical exam: General: Patient sitting back in bed, no acute distress Respiratory: Normal work of breathing on room air. Clear to auscultation bilaterally; no wheezes, crackles.  Right lower lobe sounds improved from this morning  A&P Pleural Effusion - Lung sounds improved postprocedure - Pain regimen: Tylenol  1000 mg Q6H PRN - Fluid labs pending  Remainder of assessment and plan per prior progress note.  Alan Flies, MD 09/06/24 2:17 PM

## 2024-09-06 NOTE — Progress Notes (Signed)
 [facility] NEPHROLOGY PROGRESS NOTE  Assessment/ Plan: Pt is a 78 y.o. 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  Followed by [name] at [facility] and at the [facility].  He has LUE AV fistula created on 11/20/2023. -First HD on 11/11, cannulated AV fistula well.   -Completed 3 HD treatments, tolerated well and clinically much improved.  Outpatient dialysis arranged at [facility], MWF, 10 am chair time, with a tentative start date of Monday the 17th.  - Plan for next HD on Monday which can be done outpatient. -Ok to discharge from renal perspective.  Discussed with the primary team.   # Fluid overload/lower extreme edema: Continue to ultrafiltrate with dialysis, recommend fluid restriction.  Edema is improving well.   # Hypertension: Managing volume with dialysis, holding antihypertensives.   # Anemia of CKD: Patient used to get iron infusion as outpatient.  Iron saturation 15%, serum iron is 20.  Treating with  IV iron and erythropoietin.  I think the benefit of erythropoietin outweighs the risk.   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  Monitor calcium  and phosphorus level.  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  Possibly thoracocentesis today.   Subjective: Seen and examined at bedside.  He is more alert and comfortable.  Denies nausea, vomiting, chest pain or shortness of breath.  He is happy that he is swelling is improving.  Objective Vital signs in last 24  hours: Vitals:   09/05/24 1937 09/06/24 0004 09/06/24 0446 09/06/24 0708  BP: (!) 150/67 (!) 154/63 (!) 163/67 (!) 151/62  Pulse: 88 84 88 86  Resp: 18 17 18 18   Temp: 98.4 F (36.9 C) 99.2 F (37.3 C) 99.2 F (37.3 C) 98.3 F (36.8 C)  TempSrc: Oral Oral Oral Oral  SpO2: 91% 93% 94% 95%  Weight:      Height:       Weight change: -0.3 kg  Intake/Output Summary (Last 24 hours) at 09/06/2024 1017 Last data filed at 09/06/2024 0920 Gross per 24 hour  Intake 360 ml  Output 2250 ml  Net -1890 ml       Labs: RENAL PANEL Recent Labs  Lab 09/03/24 0733 09/03/24 2144 09/04/24 1201 09/04/24 1446 09/05/24 0246 09/06/24 0216  NA 139 141 144  --  138 137  K 5.1 4.3 4.7  --  4.3 3.9  CL 109 107 109  --  103 100  CO2 16* 22 24  --  26 27  GLUCOSE 102* 111* 90  --  91 101*  BUN 60* 37* 40*  --  25* 13  CREATININE 7.58* 5.55* 6.08*  --  4.53* 3.41*  CALCIUM  8.3* 8.1* 8.3*  --  7.9* 7.8*  MG  --   --   --  2.0  --   --   PHOS  --  4.0 4.4  --  3.7 2.8  ALBUMIN 2.4* 2.3* 2.2*  --  2.1* 2.0*    Liver Function Tests: Recent Labs  Lab 09/03/24 310 032 0776  09/03/24 2144 09/04/24 1201 09/05/24 0246 09/06/24 0216  AST 15  --   --   --   --   ALT 9  --   --   --   --   ALKPHOS 47  --   --   --   --   BILITOT 0.5  --   --   --   --   PROT 6.7  --   --   --   --   ALBUMIN 2.4*   < > 2.2* 2.1* 2.0*   < > = values in this interval not displayed.   No results for input(s): LIPASE, AMYLASE in the last 168 hours. No results for input(s): AMMONIA in the last 168 hours. CBC: Recent Labs    09/03/24 0733 09/03/24 2144 09/04/24 1201 09/04/24 1446 09/05/24 0246 09/06/24 0216  HGB 8.5* 8.8* 8.6*  --  8.1* 9.2*  MCV 84.8 81.7 84.4  --  82.9 83.2  FERRITIN  --   --   --  576*  --   --   TIBC  --   --   --  132*  --   --   IRON  --   --   --  20*  --   --     Cardiac Enzymes: No results for input(s): CKTOTAL, CKMB, CKMBINDEX, TROPONINI in the last 168  hours. CBG: Recent Labs  Lab 09/04/24 2109 09/05/24 0618 09/05/24 1101 09/05/24 2103 09/06/24 0823  GLUCAP 152* 79 99 120* 84    Iron Studies:  Recent Labs    09/04/24 1446  IRON 20*  TIBC 132*  FERRITIN 576*   Studies/Results: US  THYROID Result Date: 09/05/2024 CLINICAL DATA:  Incidental on CT. Bilateral thyroid nodules detected by CT of chest. EXAM: THYROID ULTRASOUND TECHNIQUE: Ultrasound examination of the thyroid gland and adjacent soft tissues was performed. COMPARISON:  CT chest 09/04/2024 FINDINGS: Parenchymal Echotexture: Mildly heterogenous Isthmus: 0.2 cm Right lobe: 4.1 x 2.5 x 2.6 cm Left lobe: 2.8 x 1.5 x 1.4 cm _________________________________________________________ Estimated total number of nodules >/= 1 cm: 2 Number of spongiform nodules >/=  2 cm not described below (TR1): 0 Number of mixed cystic and solid nodules >/= 1.5 cm not described below (TR2): 0 _________________________________________________________ Nodule # 1: Location: Right; Mid Maximum size: 2.6 cm; Other 2 dimensions: 1.9 x 1.9 cm Composition: cystic/almost completely cystic (0) Echogenicity: anechoic (0) Shape: not taller-than-wide (0) Margins: smooth (0) Echogenic foci: none (0) ACR TI-RADS total points: 0. ACR TI-RADS risk category: TR1 (0-1 points). ACR TI-RADS recommendations: This nodule does NOT meet TI-RADS criteria for biopsy or dedicated follow-up. _________________________________________________________ Nodule # 2: Location: Left; Superior Maximum size: 1.5 cm; Other 2 dimensions: 0.8 x 0.9 cm Composition: cystic/almost completely cystic (0) Echogenicity: anechoic (0) Shape: not taller-than-wide (0) Margins: smooth (0) Echogenic foci: none (0) ACR TI-RADS total points: 0. ACR TI-RADS risk category: TR1 (0-1 points). ACR TI-RADS recommendations: This nodule does NOT meet TI-RADS criteria for biopsy or dedicated follow-up. _________________________________________________________ No enlarged or  abnormal appearing lymph nodes are identified. IMPRESSION: Bilateral thyroid cysts. Both do not meet criteria for biopsy or follow-up. Electronically Signed   By: Marcey Moan M.D.   On: 09/05/2024 14:22   CT CHEST W CONTRAST Result Date: 09/05/2024 CLINICAL DATA:  Pleural effusion. Malignancy suspected. History of pulmonary nodules. EXAM: CT CHEST WITH CONTRAST TECHNIQUE: Multidetector CT imaging of the chest was performed during intravenous contrast administration. RADIATION DOSE REDUCTION: This exam was performed according to  the departmental dose-optimization program which includes automated exposure control, adjustment of the mA and/or kV according to patient size and/or use of iterative reconstruction technique. CONTRAST:  50mL OMNIPAQUE IOHEXOL 350 MG/ML SOLN COMPARISON:  PET-CT 03/18/2009. FINDINGS: Cardiovascular: The heart is enlarged. Trace pericardial effusion. Coronary artery calcification is evident. Mild atherosclerotic calcification is noted in the wall of the thoracic aorta. Mediastinum/Nodes: 2.1 cm right thyroid nodule. Smaller left thyroid nodule. 11 mm short axis subcarinal lymph node upper normal to borderline enlarged for size. The esophagus has normal imaging features. There is no hilar lymphadenopathy. The esophagus has normal imaging features. Lungs/Pleura: 7.1 x 5.3 cm masslike mixed density lesion identified parahilar left upper lobe. 2.4 x 1.7 cm left lower lobe pulmonary nodule was 2.6 x 2.1 cm on PET-CT 03/18/2009 without hypermetabolism. Dependent collapse/consolidation is seen in the right middle and lower lobes with moderate to large right pleural effusion. No substantial left-sided pleural effusion with minimal dependent atelectasis in the left lower lobe. Upper Abdomen: Small volume free fluid noted adjacent to the liver. 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 Small left renal  cyst evident. Musculoskeletal: No worrisome lytic or sclerotic osseous abnormality. IMPRESSION: 1. 7.1 x 5.3 cm masslike mixed density lesion in the parahilar left upper lobe. While pneumonia is a consideration, CT imaging features certainly raise concern for adenocarcinoma. Close follow-up warranted and tissue sampling likely indicated. 2. 2.4 x 1.7 cm left lower lobe pulmonary nodule was 2.6 x 2.1 cm on PET-CT 03/18/2009 without hypermetabolism. Findings most consistent with benign etiology. 3. Dependent collapse/consolidation in the right middle and lower lobes with moderate to large right pleural effusion. 4. Small volume free fluid adjacent to the liver. 5. 2.1 cm right thyroid nodule. Recommend thyroid US  (ref: J Am Coll Radiol. 2015 Feb;12(2): 143-50). 6.  Aortic Atherosclerosis (ICD10-I70.0). Electronically Signed   By: Camellia Candle M.D.   On: 09/05/2024 05:23    Medications: Infusions:  iron sucrose 200 mg (09/06/24 1011)     Scheduled Medications:  amoxicillin -clavulanate  1 tablet Oral QHS   Chlorhexidine  Gluconate Cloth  6 each Topical Q0600   darbepoetin (ARANESP) injection - DIALYSIS  60 mcg Subcutaneous Q Thu-1800   feeding supplement (NEPRO CARB STEADY)  237 mL Oral BID BM   hydrocortisone cream   Topical BID   insulin  aspart  0-6 Units Subcutaneous TID WC   polyethylene glycol  17 g Oral Daily   senna-docusate  1 tablet Oral BID   sodium chloride  flush  3 mL Intravenous Q12H    have reviewed scheduled and prn medications.  Physical Exam: General:NAD, comfortable Heart:RRR, s1s2 nl Lungs:clear b/l, no crackle Abdomen:soft, Non-tender, non-distended Extremities: Bilateral lower extremities pitting edema +, gradually improving. Dialysis Access: Left AV fistula has a good thrill.  Ernest Walker 09/06/2024,10:17 AM  LOS: 3 days

## 2024-09-06 NOTE — Assessment & Plan Note (Signed)
 Bilateral thyroid cysts per thyroid US  from 11/13, no further workup indicated.

## 2024-09-06 NOTE — Progress Notes (Signed)
 Hep b antibody lab results faxed to out-pt HD clinic [facility] at this time. Start date still set as Monday. Will continue to assist as needed.   Nicholus Chandran Dialysis Navigator [phone]

## 2024-09-06 NOTE — Telephone Encounter (Signed)
 HFU scheduled with JE on 12/16. CT will need to be scheduled prior.

## 2024-09-06 NOTE — Progress Notes (Addendum)
 Assisted with right thoracentesis. VSS stable throughout. Consent signed, in chart. Approximately 1L drained. Specimens taken to lab by staff member. Site with band aid, small amount drainage. Patient without complaints.

## 2024-09-06 NOTE — Assessment & Plan Note (Signed)
-   Pain regimen: Tylenol  1000 mg Q6H PRN - No NSAIDs d/t ERSD - Hydrocortisone cream 1% BID - Bowel regimen: Miralax daily, Senna BID

## 2024-09-06 NOTE — Consult Note (Signed)
 NAME:  Ernest Walker, MRN:  [id number], DOB:  1946/09/07, LOS: 3 ADMISSION DATE:  09/03/2024, CONSULTATION DATE:  11/13 REFERRING MD:  Krystal McDiarmid, MD, CHIEF COMPLAINT:  Shortness of breath   History of Present Illness:  Ernest Walker is a 78 y.o. male with PMH of T2DM, ESRD (new this admission; known CKD), HTN, HLD who presented with dyspnea on exertion and bilateral lower extremity edema that had been gradually increasing and was admitted on 11/11 with volume overload. He underwent diuresis on 11/11 and 11/12. Found to have a right pleural effusion on chest x-ray. Follow up CT scan of the chest revealed a lung nodule that was concerning for possible malignancy and pulmonology was consulted for potential biopsy and thoracentesis.  Per chart review, he has a history of a LLL mass dating back to to 2004. This nodule was under surveillance and is smaller on the current CT scan then it was on a PT scan in 2010.  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   There is also a large right sided pleural effusion.  He has had no pulmonary symptoms before the 3 weeks prior to his hospitalization.  He has also had an associated dry cough over this period of time.  Prior to this current episode, he was able to climb flights of stairs and walk for long periods of time without being short of breath.  He does not have any sick contacts.  He denies fevers, chills, lymph nodes, chest pain, headaches, abdominal pain, nausea, vomiting, weight loss.   He has a 5-pack-year history of smoking from 1972-1977.  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  He does not know if he was exposed to asbestos in any of the houses that he lived.  He has not ever vaped.  Pertinent  Medical History  TIIDM Newly diagnosed ESRD HTN HLD Thyroid nodule  Significant Hospital  Events: Including procedures, antibiotic start and stop dates in addition to other pertinent events   Admitted on 11/11 with volume overload CT scan with mass as above on 11/12 Dialysis 11/11, 11/12 and 11/13  Interim History / Subjective:  Underwent dialysis yesterday with removal of fluid. Feels subjectively better and able to fit his feet into his shoes. He notes that he still feels short of breath when walking. He does not have any chest pain. He does still endorse peripheral swelling.  Objective    Blood pressure (!) 151/62, pulse 86, temperature 98.3 F (36.8 C), temperature source Oral, resp. rate 18, height 5' 9 (1.753 m), weight 75 kg, SpO2 95%.        Intake/Output Summary (Last 24 hours) at 09/06/2024 1048 Last data filed at 09/06/2024 0920 Gross per 24 hour  Intake 360 ml  Output 2250 ml  Net -1890 ml   Filed Weights   09/04/24 1802 09/05/24 1451 09/05/24 1832  Weight: 77.5 kg 77.4 kg 75 kg    Examination: General: Alert, lying in HD, NAD Lungs: Decreased lung sounds over the right lung, unchanged from day prior Cardiovascular: RRR Abdomen: Soft, NT, ND Extremities: 2+ pitting edema bilateral lower extremities Neuro: AxO x4  Bedside ultrasound of the right lung demonstrated absent lung sliding over the area of the 9th and 10th rib posteriorly, extending up superiorly. There is a fluid collection that is at least 6cm  deep with lung visualized along the anterior chest wall. Adequate area for thoracentesis.  Resolved problem list   Assessment and Plan   Right-sided Pleural Effusion Large pleural effusion noted on CT scan. Likely contributing to his dyspneic on exertion. Was not noted on prior imaging but there is no imaging since a chest x-ray in 2022. On exam, he has decreased lung sounds in his right lung compared to his left. Unclear what the cause of this effusion is but hopefully the pleural fluid studies will elucidate this. Differential includes malignant  effusion, HFpEF, or volume overload from newly diagnosed ESRD. On bedside ultrasound, there was evidence of a large pleural effusion, adequate for thoracentesis, as described above.  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 No fevers, chills, weight loss. He has a dry cough around the same time period. While the CT scan is concerning for malignancy, the presence of air brochograms within the mass would be more characteristic of an infectious process. While he does not currently have infectious symptoms, we would favor a trial of antibiotics and an interval CT to assess improvement with a short antibiotic course. If no improvement in imaging, would proceed with navigated biopsy.  -Augmentin  BID 500-125mg  daily at bedtime for a 10 day course. -Follow-up with pulmonology outpatient in one month -Repeat CT chest with contrast prior to outpatient visit -Bronchoscopy guided biopsy if no improvement on CT  LLL nodule Stable since prior imaging.  Management of ESRD, HFpEF, and chronic health conditions per [facility] Thank you for this interesting consult, we will continue to follow.   Labs   CBC: Recent Labs  Lab 09/03/24 0733 09/03/24 2144 09/04/24 1201 09/05/24 0246 09/06/24 0216  WBC 7.8 8.4 8.9 9.1 8.8  HGB 8.5* 8.8* 8.6* 8.1* 9.2*  HCT 28.0* 27.7* 27.6* 26.2* 29.2*  MCV 84.8 81.7 84.4 82.9 83.2  PLT 237 231 231 225 220    Basic Metabolic Panel: Recent Labs  Lab 09/03/24 0733 09/03/24 2144 09/04/24 1201 09/04/24 1446 09/05/24 0246 09/06/24 0216  NA 139 141 144  --  138 137  K 5.1 4.3 4.7  --  4.3 3.9  CL 109 107 109  --  103 100  CO2 16* 22 24  --  26 27  GLUCOSE 102* 111* 90  --  91 101*  BUN 60* 37* 40*  --  25* 13  CREATININE 7.58* 5.55* 6.08*  --  4.53* 3.41*  CALCIUM  8.3* 8.1* 8.3*  --  7.9*  7.8*  MG  --   --   --  2.0  --   --   PHOS  --  4.0 4.4  --  3.7 2.8   GFR: Estimated Creatinine Clearance: 17.3 mL/min (A) (by C-G formula based on SCr of 3.41 mg/dL (H)). Recent Labs  Lab 09/03/24 2144 09/04/24 1201 09/05/24 0246 09/06/24 0216  WBC 8.4 8.9 9.1 8.8    Liver Function Tests: Recent Labs  Lab 09/03/24 0733 09/03/24 2144 09/04/24 1201 09/05/24 0246 09/06/24 0216  AST 15  --   --   --   --   ALT 9  --   --   --   --   ALKPHOS 47  --   --   --   --   BILITOT 0.5  --   --   --   --  PROT 6.7  --   --   --   --   ALBUMIN 2.4* 2.3* 2.2* 2.1* 2.0*   No results for input(s): LIPASE, AMYLASE in the last 168 hours. No results for input(s): AMMONIA in the last 168 hours.  ABG    Component Value Date/Time   TCO2 19 08/18/2015 0907     Coagulation Profile: No results for input(s): INR, PROTIME in the last 168 hours.  Cardiac Enzymes: No results for input(s): CKTOTAL, CKMB, CKMBINDEX, TROPONINI in the last 168 hours.  HbA1C: Hgb A1c MFr Bld  Date/Time Value Ref Range Status  09/04/2024 02:46 PM 6.2 (H) 4.8 - 5.6 % Final    Comment:    (NOTE) Diagnosis of Diabetes The following HbA1c ranges recommended by the American Diabetes Association (ADA) may be used as an aid in the diagnosis of diabetes mellitus.  Hemoglobin             Suggested A1C NGSP%              Diagnosis  <5.7                   Non Diabetic  5.7-6.4                Pre-Diabetic  >6.4                   Diabetic  <7.0                   Glycemic control for                       adults with diabetes.    11/29/2020 05:44 PM 7.7 (H) 4.8 - 5.6 % Final    Comment:    (NOTE) Pre diabetes:          5.7%-6.4%  Diabetes:              >6.4%  Glycemic control for   <7.0% adults with diabetes     CBG: Recent Labs  Lab 09/04/24 2109 09/05/24 0618 09/05/24 1101 09/05/24 2103 09/06/24 0823  GLUCAP 152* 79 99 120* 84    Past Medical History:  He,  has a past  medical history of Arthritis, Carpal tunnel syndrome of right wrist, Diabetes mellitus without complication (HCC), Hypertension, and Prostate cancer (HCC) (2007).   Surgical History:   Past Surgical History:  Procedure Laterality Date   CARPAL TUNNEL RELEASE Right 08/18/2015   Procedure: RIGHT CARPAL TUNNEL RELEASE;  Surgeon: Arley Curia, MD;  Location: [facility];  Service: Orthopedics;  Laterality: Right;  block in preop   INCISION AND DRAINAGE DEEP NECK ABSCESS     INSERTION PROSTATE RADIATION SEED  2007   at [place] in [facility] NERVE TRANSPOSITION Right 08/18/2015   Procedure:  DECOMPRESSION ULNAR NERVE RIGHT WRIST WITH DEBRIDEMENT ULNAR NERVE;  Surgeon: Arley Curia, MD;  Location: [facility];  Service: Orthopedics;  Laterality: Right;  block in preop     Social History:   reports that he has quit smoking. He has never used smokeless tobacco. He reports that he does not drink alcohol and does not use drugs.   Family History:  His Family history is unknown by patient.   Allergies Allergies  Allergen Reactions   Nsaids Other (See Comments)    Contraindication due to CKD      Home Medications  Prior to Admission medications   Medication  Sig Start Date End Date Taking? Authorizing Provider  amLODipine  (NORVASC ) 10 MG tablet Take 10 mg by mouth daily. 04/12/22  Yes [provider]  atorvastatin  (LIPITOR) 80 MG tablet TAKE ONE-HALF TABLET BY MOUTH AT BEDTIME FOR CHOLESTEROL 10/12/21  Yes [provider]  insulin  glargine-yfgn (SEMGLEE) 100 UNIT/ML Pen Inject 10 Units into the skin daily. 10/07/21  Yes [provider]  lisinopril (ZESTRIL) 40 MG tablet TAKE ONE-HALF TABLET BY MOUTH EVERY DAY FOR BLOOD PRESSURE 04/12/22  Yes [provider]  terazosin  (HYTRIN ) 10 MG capsule Take 1 capsule by mouth at bedtime. 04/12/22  Yes [provider]  aspirin 81 MG chewable tablet CHEW ONE TABLET BY MOUTH EVERY DAY ASK YOUR  DOCTOR HOW LONG YOU SHOULD TAKE THIS MEDICATION Patient not taking: Reported on 09/03/2024 04/12/22   [provider]  calcitRIOL (ROCALTROL) 0.25 MCG capsule Take 1 capsule by mouth 2 (two) times daily. Patient not taking: Reported on 09/03/2024 04/12/22   [provider]  dorzolamide-timolol (COSOPT) 2-0.5 % ophthalmic solution Apply 1 drop to eye 2 (two) times daily. Patient not taking: Reported on 09/03/2024 05/06/24   [provider]  empagliflozin (JARDIANCE) 25 MG TABS tablet Take 0.5 tablets by mouth daily. Patient not taking: Reported on 09/03/2024 09/29/20   [provider]  ferrous sulfate 325 (65 FE) MG tablet Take 325 mg by mouth See admin instructions. Take one tablet on Monday, Wednesday, and Friday Patient not taking: Reported on 09/03/2024 11/09/20   [provider]  sevelamer carbonate (RENVELA) 800 MG tablet Take 800 mg by mouth 3 (three) times daily with meals. Patient not taking: Reported on 09/03/2024 07/05/24   [provider]  sodium bicarbonate 650 MG tablet Take 1,300 mg by mouth 2 (two) times daily. Patient not taking: Reported on 09/03/2024 07/05/24   [provider]    Melvenia Morrison, PGY-1 Internal Medicine Residency Critical care service

## 2024-09-06 NOTE — Progress Notes (Signed)
 Daily Progress Note Intern Pager: [phone]  Patient name: SAXON BARICH Medical record number: [id number] Date of birth: 04/27/1946 Age: 78 y.o. Gender: male  [facility]: Nephrology [facility]) (s/o); pulm  Code Status: Full  Pt Overview and Major Events to Date:  - 11/11: Admitted, nephro consulted  - 11/13: Pulm consulted for lung biopsy - 11/14: Thoracentesis  Medical Decision Making:  with PMH of T2DM, ESRD (new this admission; known CKD), HTN, HLD. Admitted for hypervolemia 2/2 new ESRD and managed with initiating dialysis. Also found to have right pleural effusion and left lung mass along with thyroid nodule. Thoracentesis of pleural effusion planned for today with pulm; will follow lung mass outpatient and pulm started patient on antibiotic to address potential infection. Thyroid ultrasound done 11/13 revealed cysts, no need for biopsy or follow-up.  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 Nephro can continue to follow outpatient. Assessment & Plan Hypervolemia associated with renal insufficiency ESRD (end stage renal disease) (HCC) SOB (shortness of breath) (HFpEF) heart failure with preserved ejection fraction (HCC) Dialysis 11/13 with 1.5 L out. No plan for further dialysis inpatient per nephro. - Nephrology has signed off - Outpatient dialysis set up MWF with first session on Mon 11/17 - Labs: Holiday - Feeding supplement BID between meals per RD - Bowel regiment: Miralax daily, Senna BID - PT/OT eval and treat - Fall precautions - Delirium precautions Lung nodule CT Chest with finding of 7.1 x 5.3 cm masslike mixed density lesion in the parahilar left upper lobe concerning for possible adenocarcinoma. - Augmentin  500-125 mg daily started for possible infectious source (11/13- ) for 10 days (per pulm) - Plan per pulm for  imaging and outpatient follow-up in 1 month Pleural effusion, right - Thoracentesis today with pulm - Post-procedure CXR - Pain regimen: Tylenol  1000 mg q6h PRN Hemorrhoid - Pain regimen: Tylenol  1000 mg Q6H PRN - No NSAIDs d/t ERSD - Hydrocortisone cream 1% BID - Bowel regimen: Miralax daily, Senna BID Thyroid nodule (Resolved: 09/06/2024) Bilateral thyroid cysts per thyroid US  from 11/13, no further workup indicated. Chronic health problem Anemia: Hgb stable at 9.2. Found to have low iron of 20; supplementing with IV iron. HLD: Hold home atorvastatin  40 mg at bedtime HTN: Holding home amlodipine  10 mg daily, lisinopril 20 mg daily (lisinopril not filled since 11/2023) T2DM: A1c of 6.2%. Holding home insulin  glargine 10u daily, plan to hold at discharge; will stop checking CBGs and discontinue sSSI during admission   FEN/GI: Renal with fluid restriction of 1200 mL PPx: Holding Heparin  in anticipation of thoracentesis today Dispo:Home pending recovery from thoracentesis, either today 11/14 or tomorrow 11/15.  Subjective:  He is feeling okay overall this morning.  Breathing remains good at rest, though reports dyspnea on exertion still.  Slept poorly last night.  Still reports some hip pain, which he relates to the bed.  Did have a bowel movement this morning, small, no blood, no pain.  Objective: Temp:  [98 F (36.7 C)-99.4 F (37.4 C)] 99.2 F (37.3 C) (11/14 0446) Pulse Rate:  [79-91] 88 (11/14 0446) Resp:  [16-33] 18 (11/14 0446) BP: (124-171)/(63-83) 163/67 (11/14 0446) SpO2:  [91 %-99 %] 94 % (11/14 0446) Weight:  [75 kg-77.4 kg] 75 kg (11/13 1832) Physical Exam: General: Lying back in bed, no acute distress Cardiovascular: Regular rate and rhythm, no murmurs/rubs/gallops. Respiratory: Normal work of breathing on  room air.  Right lower lobe with decreased breath sounds; no wheezes, crackles.  Normal left lobe lung sounds. Abdomen: Bowel sounds present and normoactive  bilaterally. Soft, nondistended.  Tenderness to right of umbilicus, no guarding, no rebound. Extremities: Skin warm, dry. 1-2+ bilateral lower extremity pitting edema.  Laboratory: Most recent CBC Lab Results  Component Value Date   WBC 8.8 09/06/2024   HGB 9.2 (L) 09/06/2024   HCT 29.2 (L) 09/06/2024   MCV 83.2 09/06/2024   PLT 220 09/06/2024   Most recent BMP    Latest Ref Rng & Units 09/06/2024    2:16 AM  BMP  Glucose 70 - 99 mg/dL 898   BUN 8 - 23 mg/dL 13   Creatinine 9.38 - 1.24 mg/dL 6.58   Sodium 864 - 854 mmol/L 137   Potassium 3.5 - 5.1 mmol/L 3.9   Chloride 98 - 111 mmol/L 100   CO2 22 - 32 mmol/L 27   Calcium  8.9 - 10.3 mg/dL 7.8    PTH 88/87: 892  Imaging/Diagnostic Tests: Thyroid US  11/13: Bilateral thyroid cysts. Both do not meet criteria for biopsy or follow-up.  Larraine Palma, MD 09/06/2024, 7:09 AM  PGY-1, [facility] FPTS Intern pager: [phone], text pages welcome Secure chat group [facility] Teaching Service

## 2024-09-06 NOTE — Plan of Care (Signed)
  Problem: Fluid Volume: Goal: Ability to maintain a balanced intake and output will improve Outcome: Progressing   Problem: Metabolic: Goal: Ability to maintain appropriate glucose levels will improve Outcome: Progressing   Problem: Tissue Perfusion: Goal: Adequacy of tissue perfusion will improve Outcome: Progressing   Problem: Health Behavior/Discharge Planning: Goal: Ability to manage health-related needs will improve Outcome: Progressing   Problem: Clinical Measurements: Goal: Diagnostic test results will improve Outcome: Progressing   Problem: Fluid Volume: Goal: Compliance with measures to maintain balanced fluid volume will improve Outcome: Progressing

## 2024-09-07 ENCOUNTER — Other Ambulatory Visit (HOSPITAL_COMMUNITY): Payer: Self-pay

## 2024-09-07 DIAGNOSIS — J90 Pleural effusion, not elsewhere classified: Secondary | ICD-10-CM | POA: Diagnosis not present

## 2024-09-07 DIAGNOSIS — N289 Disorder of kidney and ureter, unspecified: Secondary | ICD-10-CM | POA: Diagnosis not present

## 2024-09-07 DIAGNOSIS — R918 Other nonspecific abnormal finding of lung field: Secondary | ICD-10-CM | POA: Diagnosis not present

## 2024-09-07 DIAGNOSIS — E8770 Fluid overload, unspecified: Secondary | ICD-10-CM | POA: Diagnosis not present

## 2024-09-07 MED ORDER — AMOXICILLIN-POT CLAVULANATE 500-125 MG PO TABS
1.0000 | ORAL_TABLET | Freq: Every day | ORAL | 0 refills | Status: AC
  Filled 2024-09-07: qty 8, 8d supply, fill #0

## 2024-09-07 MED ORDER — HYDROCORTISONE 1 % EX CREA
TOPICAL_CREAM | Freq: Two times a day (BID) | CUTANEOUS | 0 refills | Status: AC
  Filled 2024-09-07: qty 28, 30d supply, fill #0

## 2024-09-07 MED ORDER — HEPARIN SODIUM (PORCINE) 5000 UNIT/ML IJ SOLN: 5000.0000 [IU] | Freq: Three times a day (TID) | INTRAMUSCULAR | Status: DC

## 2024-09-07 NOTE — Progress Notes (Addendum)
 Son to arrive and completed AVS education. Patient eating lunch.   PIV and tele removed.  Dressing intact.  CCMD/Namasvi.   TOC meds at bedside.    NEEDS: AVS education to be completed with son.  Unable to locate RIDE or family.  Messages left   Salome RN updated

## 2024-09-07 NOTE — Assessment & Plan Note (Addendum)
 Anemia: Stable throughout admission. Received IV iron during admission.  HLD: Hold home atorvastatin  40 mg at bedtime given ESRD HTN: Holding home amlodipine  10 mg daily, lisinopril 20 mg daily (lisinopril not filled since 11/2023) T2DM: A1c of 6.2%. Holding home insulin  glargine 10u daily, plan to hold at discharge; will stop checking CBGs and discontinue sSSI during admission

## 2024-09-07 NOTE — Progress Notes (Signed)
 Mobility Specialist Progress Note:    09/07/24 1125  Mobility  Activity Ambulated with assistance  Level of Assistance Standby assist, set-up cues, supervision of patient - no hands on  Assistive Device Cane  Distance Ambulated (ft) 200 ft  Activity Response Tolerated well  Mobility Referral Yes  Mobility visit 1 Mobility  Mobility Specialist Start Time (ACUTE ONLY) 0910  Mobility Specialist Stop Time (ACUTE ONLY) [id number]  Mobility Specialist Time Calculation (min) (ACUTE ONLY) 14 min   Received pt in bed having no complaints and agreeable to mobility. Pt was asymptomatic throughout ambulation and returned to room w/o fault. Left in bed w/ call bell in reach and all needs met.   Thersia Minder Mobility Specialist  Please contact vis Secure Chat or  Rehab Office [phone]

## 2024-09-07 NOTE — Assessment & Plan Note (Signed)
-   Pain regimen: Tylenol  1000 mg Q6H PRN - No NSAIDs d/t ERSD - Hydrocortisone cream 1% BID - Bowel regimen: Miralax daily, Senna BID

## 2024-09-07 NOTE — Progress Notes (Signed)
     Daily Progress Note Intern Pager: [phone]  Patient name: Ernest Walker Medical record number: [id number] Date of birth: 1946-07-31 Age: 78 y.o. Gender: male  [facility]: PCCM Code Status: Full  Pt Overview and Major Events to Date:  11/11: Admitted to FMTS 11/14: Thoracentesis  Medical Decision Making:  Ernest Walker 78 y.o. with hx CKD, T2DM, HTN, HLD admitted for hypervolemia 2/2 ESRD, initiated on HD.  Assessment & Plan Hypervolemia associated with renal insufficiency ESRD (end stage renal disease) (HCC) SOB (shortness of breath) (HFpEF) heart failure with preserved ejection fraction (HCC) Initiated on HD during admission, received HD on 11/13 with 1.5L fluid removal.  -Appreciate Nephrology recs - signed off at this time -Outpatient MWF HD plan  Pleural effusion, right S/p R rhoracentesis 09/06/24 with 1L serosanguinous fluid removal. Pleural fluid with few WBC, no organisms seen thus far.   - FU fluid cytology  - Pain regimen: Tylenol  1000 mg q6h PRN Lung nodule CT Chest with finding of 7.1 x 5.3 cm masslike mixed density lesion in the parahilar left upper lobe concerning for possible adenocarcinoma. - Augmentin  500-125 mg daily for possible infection (11/13-11/22 ) per pulm - Per Pulm, plan for follow up imaging and outpatient follow-up in 1 month  Hemorrhoid - Pain regimen: Tylenol  1000 mg Q6H PRN - No NSAIDs d/t ERSD - Hydrocortisone cream 1% BID - Bowel regimen: Miralax daily, Senna BID Chronic health problem Anemia: Stable throughout admission. Received IV iron during admission.  HLD: Hold home atorvastatin  40 mg at bedtime given ESRD HTN: Holding home amlodipine  10 mg daily, lisinopril 20 mg daily (lisinopril not filled since 11/2023) T2DM: A1c of 6.2%. Holding home insulin  glargine 10u daily, plan to hold at discharge; will stop checking CBGs and discontinue sSSI during admission   FEN/GI: Renal diet, 1200  fluid restriction PPx: Heparin   Dispo: Home with HH   Subjective:  Reports doing well this morning. Breathing improved. Interested in going home.   Objective: Temp:  [98.1 F (36.7 C)-98.5 F (36.9 C)] 98.1 F (36.7 C) (11/15 0429) Pulse Rate:  [88-97] 88 (11/15 0429) Resp:  [16-19] 18 (11/15 0429) BP: (143-162)/(54-66) 161/60 (11/15 0429) SpO2:  [92 %-96 %] 96 % (11/15 0429) Physical Exam: General: No acute distress. Resting comfortably.  Cardiovascular: Normal S1/S2. No extra heart sounds. Respiratory: Breathing comfortably on RA. Bibasilar crackles. No increased WOB.  Abdomen: Soft, nontender, nondistended.  Extremities: Chronic bilateral pitting ankle edema, nontender.   Laboratory: Most recent CBC Lab Results  Component Value Date   WBC 8.8 09/06/2024   HGB 9.2 (L) 09/06/2024   HCT 29.2 (L) 09/06/2024   MCV 83.2 09/06/2024   PLT 220 09/06/2024   Most recent BMP    Latest Ref Rng & Units 09/06/2024    2:16 AM  BMP  Glucose 70 - 99 mg/dL 898   BUN 8 - 23 mg/dL 13   Creatinine 9.38 - 1.24 mg/dL 6.58   Sodium 864 - 854 mmol/L 137   Potassium 3.5 - 5.1 mmol/L 3.9   Chloride 98 - 111 mmol/L 100   CO2 22 - 32 mmol/L 27   Calcium  8.9 - 10.3 mg/dL 7.8     Ernest Perkins, MD 09/07/2024, 7:29 AM  PGY-2, [place] [facility] FPTS Intern pager: [phone], text pages welcome Secure chat group [facility] Teaching Service

## 2024-09-07 NOTE — Assessment & Plan Note (Addendum)
 Initiated on HD during admission, received HD on 11/13 with 1.5L fluid removal.  -Appreciate Nephrology recs - signed off at this time -Outpatient MWF HD plan

## 2024-09-07 NOTE — Plan of Care (Signed)
  Problem: Education: Goal: Ability to describe self-care measures that may prevent or decrease complications (Diabetes Survival Skills Education) will improve Outcome: Progressing   Problem: Coping: Goal: Ability to adjust to condition or change in health will improve Outcome: Progressing   Problem: Fluid Volume: Goal: Ability to maintain a balanced intake and output will improve Outcome: Progressing   Problem: Metabolic: Goal: Ability to maintain appropriate glucose levels will improve Outcome: Progressing   Problem: Skin Integrity: Goal: Risk for impaired skin integrity will decrease Outcome: Progressing   Problem: Nutrition: Goal: Adequate nutrition will be maintained Outcome: Progressing   Problem: Pain Managment: Goal: General experience of comfort will improve and/or be controlled Outcome: Progressing   Problem: Safety: Goal: Ability to remain free from injury will improve Outcome: Progressing   Problem: Skin Integrity: Goal: Risk for impaired skin integrity will decrease Outcome: Progressing

## 2024-09-07 NOTE — Discharge Summary (Signed)
 Family Medicine Teaching [facility] Discharge Summary  Patient name: Ernest Walker Medical record number: [id number] Date of birth: Oct 30, 1945 Age: 78 y.o. Gender: male Date of Admission: 09/03/2024  Date of Discharge: 09/07/24 Admitting Physician: Claudetta Silence, MD  [facility]: [facility]: Pulmonology  Indication for Hospitalization: Hypovolemia with ESRD  Discharge Diagnoses/Problem List:  Principal Problem for Admission: Hypovolemia with ESRD Other Problems addressed during stay:  Principal Problem:   Hypervolemia associated with renal insufficiency Active Problems:   ESRD (end stage renal disease) (HCC)   Pleural effusion, right   Hypertension   T2DM (type 2 diabetes mellitus) (HCC)   Hemorrhoid   SOB (shortness of breath)   (HFpEF) heart failure with preserved ejection fraction (HCC)   Lung nodule   Brief Hospital Course:  Ernest Walker is a 78 y.o. year old with a history of T2DM, ESRD (new this admission; known CKD), HTN, HLD who presented with dyspnea and edema and was admitted to the [facility] Medicine Teaching Service for hypervolemia associated with renal insufficiency; also found to have pleural effusions.  Hypervolemia associated with renal insufficiency ESRD HFpEF History of chronic kidney disease with fistula placement 10/2023.  Patient presented with 3 weeks of worsening kidney function and volume overload.  Got echo to exclude CHF exacerbation, which showed LVEF 50% with Grade II diastolic dysfunction.  Nephrology was consulted, and recommended starting patient on dialysis, which was initiated 09/03/24.  Was set up for outpatient dialysis, to start Monday 11/17 with MWF schedule.  Lung nodule CT Chest 11/13 with finding of 7.1 x 5.3 cm masslike mixed density lesion in the parahilar left upper lobe concerning for possible adenocarcinoma.  Pulmonology consulted, who was concerned for malignancy vs possible infection.  Patient was started on 10 day course of Augmentin  (11/13-11/22). Recommendation per pulm for repeat CT scan in ~1 month with subsequent outpatient pulm appointment.  Pleural effusions Chest x-ray 11/11 with large right sided pleural effusion and smaller left pleural effusion.  Suspect secondary to volume overload.  Repeat chest x-ray done 11/12 showed no significant change. CT with contrast was obtained 11/12 (nephro gave okay for contrast) which showed moderate to large right pleural effusion in addition to lung nodule as above. Pulm performed thoracentesis on 11/14 with 1000 cc's of millky pink-red tinged fluid drained. Post-procedure CXR with decreased size of right pleural effusion. Pleural fluid with few WBC, no organisms at time of discharge, transudate by Lights criteria.  Thyroid nodule 2.1 cm right thyroid nodule seen on CT chest 11/13. TSH of 2.268.  Thyroid ultrasound ordered, which showed bilateral thyroid cysts not requiring further workup or follow-up.  Other chronic conditions were medically managed with home medications and formulary alternatives as necessary (anemia, HTN, T2DM, hemorrhoid, HLD).  PCP Follow-up Recommendations: Follow-up hemorrhoid (started on hydrocortisone cream inpatient; decide length of time for this) Ensure compliance with dialysis. Ensure follow-up with pulm in mid-December; patient needs repeat CT chest with contrast prior to this visit Hep B antibody low on lab; consider Hep B vaccine Assess need for ASA   Results/Tests Pending at Time of Discharge:  Unresulted Labs (From admission, onward)     Start     Ordered   09/06/24 1201  Cholesterol, body fluid  (Thoracentesis Labs Panel)  Once,   R        09/06/24 1201           Disposition: Home with [facility]  Discharge Condition: Improved, stable   Discharge Exam:  Vitals:   09/07/24 0815 09/07/24 1155  BP: (!) 151/65 (!) 144/77  Pulse: 88 87  Resp: 16 16  Temp: 98.1 F (36.7 C) 98.2 F (36.8 C)   SpO2:  94%   General: No acute distress. Resting comfortably.  Cardiovascular: Normal S1/S2. No extra heart sounds. Respiratory: Breathing comfortably on RA. Bibasilar crackles. No increased WOB.  Abdomen: Soft, nontender, nondistended.  Extremities: Chronic bilateral pitting ankle edema, nontender.   Significant Procedures:  R thoracentesis 09/06/24: 1L cloudy pink-red tinged fluid drained  Significant Labs and Imaging:  Recent Labs  Lab 09/06/24 0216  WBC 8.8  HGB 9.2*  HCT 29.2*  PLT 220   Recent Labs  Lab 09/06/24 0216 09/06/24 1230  NA 137  --   K 3.9  --   CL 100  --   CO2 27  --   GLUCOSE 101*  --   BUN 13  --   CREATININE 3.41*  --   CALCIUM  7.8*  --   PHOS 2.8  --   ALBUMIN 2.0* 2.1*    CXR 09/06/24: Interval decreased R pleural effusion with improved aeration, no pneumothorax.   Chest CT 09/05/24: 7.1x5.3 cm masslike mixed density lesion in LUL. 2.4x1.7cm LLL. Moderate-large right pleural effusion.   US  thyroid 09/05/24: Bilateral thyroid cysts that do not meet criteria for biopsy or follow up  Discharge Medications:  Allergies as of 09/07/2024       Reactions   Nsaids Other (See Comments)   Contraindication due to CKD         Medication List     PAUSE taking these medications    aspirin 81 MG chewable tablet Wait to take this until your doctor or other care provider tells you to start again. CHEW ONE TABLET BY MOUTH EVERY DAY ASK YOUR DOCTOR HOW LONG YOU SHOULD TAKE THIS MEDICATION   atorvastatin  80 MG tablet Wait to take this until your doctor or other care provider tells you to start again. Commonly known as: LIPITOR TAKE ONE-HALF TABLET BY MOUTH AT BEDTIME FOR CHOLESTEROL   insulin  glargine-yfgn 100 UNIT/ML Pen Wait to take this until your doctor or other care provider tells you to start again. Commonly known as: SEMGLEE Inject 10 Units into the skin daily.   lisinopril 40 MG tablet Wait to take this until your doctor or other  care provider tells you to start again. Commonly known as: ZESTRIL TAKE ONE-HALF TABLET BY MOUTH EVERY DAY FOR BLOOD PRESSURE   terazosin  10 MG capsule Wait to take this until your doctor or other care provider tells you to start again. Commonly known as: HYTRIN  Take 1 capsule by mouth at bedtime.       STOP taking these medications    calcitRIOL 0.25 MCG capsule Commonly known as: ROCALTROL   ferrous sulfate 325 (65 FE) MG tablet   sevelamer carbonate 800 MG tablet Commonly known as: RENVELA   sodium bicarbonate 650 MG tablet       TAKE these medications    amLODipine  10 MG tablet Commonly known as: NORVASC  Take 10 mg by mouth daily.   amoxicillin -clavulanate 500-125 MG tablet Commonly known as: AUGMENTIN  Take 1 tablet by mouth at bedtime.   dorzolamide-timolol 2-0.5 % ophthalmic solution Commonly known as: COSOPT Apply 1 drop to eye 2 (two) times daily.   empagliflozin 25 MG Tabs tablet Commonly known as: JARDIANCE Take 0.5 tablets by mouth daily.   hydrocortisone cream 1 % Apply topically 2 (two) times daily.  Discharge Instructions: Please refer to Patient Instructions section of EMR for full details.  Patient was counseled important signs and symptoms that should prompt return to medical care, changes in medications, dietary instructions, activity restrictions, and follow up appointments.   Follow-Up Appointments:  Follow-up Information     James, [facility]. Go on 09/09/2024.   Why: Please arrive at 9am for first appointment Monday.   After 1st appointment, your schedule will be every Monday, Wednesday,and Friday at 10am. Contact information: [address] [place] [address] [phone]         Kassie Acquanetta Bradley, MD. Go on 10/08/2024.   Specialty: Pulmonary Disease Why: appointment scheduled for 10/08/24 at 1030.  Please call our office if you need to reschedule. Contact information: [address] [place] [address] [phone]                 Diona Perkins, MD 09/07/2024, 12:39 PM PGY-2, [facility]

## 2024-09-07 NOTE — Assessment & Plan Note (Addendum)
 CT Chest with finding of 7.1 x 5.3 cm masslike mixed density lesion in the parahilar left upper lobe concerning for possible adenocarcinoma. - Augmentin  500-125 mg daily for possible infection (11/13-11/22 ) per pulm - Per Pulm, plan for follow up imaging and outpatient follow-up in 1 month

## 2024-09-07 NOTE — Assessment & Plan Note (Addendum)
 S/p R rhoracentesis 09/06/24 with 1L serosanguinous fluid removal. Pleural fluid with few WBC, no organisms seen thus far.   - FU fluid cytology  - Pain regimen: Tylenol  1000 mg q6h PRN

## 2024-09-07 NOTE — Progress Notes (Signed)
 NAME:  Ernest Walker, MRN:  [id number], DOB:  10-Jul-1946, LOS: 4 ADMISSION DATE:  09/03/2024, CONSULTATION DATE:  11/13 REFERRING MD:  Krystal McDiarmid, MD, CHIEF COMPLAINT:  Shortness of breath   History of Present Illness:  Ernest Walker is a 78 y.o. male with PMH of T2DM, ESRD (new this admission; known CKD), HTN, HLD who presented with dyspnea on exertion and bilateral lower extremity edema that had been gradually increasing and was admitted on 11/11 with volume overload. He underwent diuresis on 11/11 and 11/12. Found to have a right pleural effusion on chest x-ray. Follow up CT scan of the chest revealed a lung nodule that was concerning for possible malignancy and pulmonology was consulted for potential biopsy and thoracentesis.  Per chart review, he has a history of a LLL mass dating back to to 2004. This nodule was under surveillance and is smaller on the current CT scan then it was on a PT scan in 2010.  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   There is also a large right sided pleural effusion.  He has had no pulmonary symptoms before the 3 weeks prior to his hospitalization.  He has also had an associated dry cough over this period of time.  Prior to this current episode, he was able to climb flights of stairs and walk for long periods of time without being short of breath.  He does not have any sick contacts.  He denies fevers, chills, lymph nodes, chest pain, headaches, abdominal pain, nausea, vomiting, weight loss.   He has a 5-pack-year history of smoking from 1972-1977.  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  He does not know if he was exposed to asbestos in any of the houses that he lived.  He has not ever vaped.  Pertinent  Medical History  TIIDM Newly diagnosed ESRD HTN HLD Thyroid nodule  Significant Hospital  Events: Including procedures, antibiotic start and stop dates in addition to other pertinent events   Admitted on 11/11 with volume overload CT scan with mass as above on 11/12 Dialysis 11/11, 11/12 and 11/13 11/14 Right thoracentesis   Interim History / Subjective:  Ongoing dry cough otherwise denies SOB, feeling much better  Objective    Blood pressure (!) 151/65, pulse 88, temperature 98.1 F (36.7 C), temperature source Oral, resp. rate 16, height 5' 9 (1.753 m), weight 75 kg, SpO2 94%.        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 No fevers, chills, weight loss. He has a dry cough around the same time period. While the CT scan is concerning for malignancy, the presence of air brochograms within the mass would be more characteristic of an infectious process.  No current infectious symptoms P:  - cont Augmentin  10 day course - plans for repeat chest CT then f/u with Dr. Kassie 10/08/24 at  1030 to determine next steps, if EBUS is needed   LLL nodule Stable since prior imaging    Nothing further to add.  Remainder per primary team.  PCCM will sign off.  Please call us  back if we can be of any further assistance.   Labs   CBC: Recent Labs  Lab 09/03/24 0733 09/03/24 2144 09/04/24 1201 09/05/24 0246 09/06/24 0216  WBC 7.8 8.4 8.9 9.1 8.8  HGB 8.5* 8.8* 8.6* 8.1* 9.2*  HCT 28.0* 27.7* 27.6* 26.2* 29.2*  MCV 84.8 81.7 84.4 82.9 83.2  PLT 237 231 231 225 220    Basic Metabolic Panel: Recent Labs  Lab 09/03/24 0733 09/03/24 2144 09/04/24 1201 09/04/24 1446 09/05/24 0246 09/06/24 0216  NA 139 141 144  --  138 137  K 5.1 4.3 4.7  --  4.3 3.9  CL 109 107 109  --  103 100  CO2 16* 22 24  --  26 27  GLUCOSE 102* 111* 90  --  91 101*  BUN 60* 37* 40*  --  25* 13  CREATININE 7.58* 5.55* 6.08*  --  4.53* 3.41*  CALCIUM  8.3* 8.1* 8.3*  --  7.9* 7.8*  MG  --   --   --  2.0  --   --   PHOS  --  4.0 4.4  --  3.7 2.8   GFR: Estimated Creatinine Clearance: 17.3 mL/min (A) (by C-G formula based on SCr of 3.41 mg/dL (H)). Recent Labs  Lab 09/03/24 2144 09/04/24 1201 09/05/24 0246 09/06/24 0216  WBC 8.4 8.9 9.1 8.8    Liver Function Tests: Recent Labs  Lab 09/03/24 0733 09/03/24 2144 09/04/24 1201 09/05/24 0246 09/06/24 0216 09/06/24 1230  AST 15  --   --   --   --   --   ALT 9  --   --   --   --   --   ALKPHOS 47  --   --   --   --   --   BILITOT 0.5  --   --   --   --   --   PROT 6.7  --   --   --   --  6.6  ALBUMIN 2.4* 2.3* 2.2* 2.1* 2.0* 2.1*   No results for input(s): LIPASE, AMYLASE in the last 168 hours. No results for input(s): AMMONIA in the last 168 hours.  ABG    Component Value Date/Time   TCO2 19 08/18/2015 0907     Coagulation Profile: No results for input(s): INR, PROTIME in the last 168 hours.  Cardiac Enzymes: No results for input(s): CKTOTAL, CKMB, CKMBINDEX, TROPONINI in the last 168  hours.  HbA1C: Hgb A1c MFr Bld  Date/Time Value Ref Range Status  09/04/2024 02:46 PM 6.2 (H) 4.8 - 5.6 % Final    Comment:    (NOTE) Diagnosis of Diabetes The following HbA1c ranges  recommended by the American Diabetes Association (ADA) may be used as an aid in the diagnosis of diabetes mellitus.  Hemoglobin             Suggested A1C NGSP%              Diagnosis  <5.7                   Non Diabetic  5.7-6.4                Pre-Diabetic  >6.4                   Diabetic  <7.0                   Glycemic control for                       adults with diabetes.    11/29/2020 05:44 PM 7.7 (H) 4.8 - 5.6 % Final    Comment:    (NOTE) Pre diabetes:          5.7%-6.4%  Diabetes:              >6.4%  Glycemic control for   <7.0% adults with diabetes     CBG: Recent Labs  Lab 09/05/24 1101 09/05/24 2103 09/06/24 0823 09/06/24 1207 09/06/24 1604  GLUCAP 99 120* 84 112* 101*   Allergies Allergies  Allergen Reactions   Nsaids Other (See Comments)    Contraindication due to CKD      Home Medications  Prior to Admission medications   Medication Sig Start Date End Date Taking? Authorizing Provider  amLODipine  (NORVASC ) 10 MG tablet Take 10 mg by mouth daily. 04/12/22  Yes [provider]  atorvastatin  (LIPITOR) 80 MG tablet TAKE ONE-HALF TABLET BY MOUTH AT BEDTIME FOR CHOLESTEROL 10/12/21  Yes [provider]  insulin  glargine-yfgn (SEMGLEE) 100 UNIT/ML Pen Inject 10 Units into the skin daily. 10/07/21  Yes [provider]  lisinopril (ZESTRIL) 40 MG tablet TAKE ONE-HALF TABLET BY MOUTH EVERY DAY FOR BLOOD PRESSURE 04/12/22  Yes [provider]  terazosin  (HYTRIN ) 10 MG capsule Take 1 capsule by mouth at bedtime. 04/12/22  Yes [provider]  aspirin 81 MG chewable tablet CHEW ONE TABLET BY MOUTH EVERY DAY ASK YOUR DOCTOR HOW LONG YOU SHOULD TAKE THIS MEDICATION Patient not taking: Reported on 09/03/2024 04/12/22   [provider]  calcitRIOL (ROCALTROL) 0.25 MCG capsule Take 1 capsule by mouth 2 (two) times daily. Patient not taking: Reported on 09/03/2024 04/12/22   [provider]  dorzolamide-timolol (COSOPT) 2-0.5 % ophthalmic solution Apply 1 drop to eye 2 (two) times daily. Patient not taking: Reported on 09/03/2024 05/06/24   [provider]  empagliflozin (JARDIANCE) 25 MG TABS tablet Take 0.5 tablets by mouth daily. Patient not taking: Reported on 09/03/2024 09/29/20   [provider]  ferrous sulfate 325 (65 FE) MG tablet Take 325 mg by mouth See admin instructions. Take one tablet on Monday, Wednesday, and Friday Patient not taking: Reported on 09/03/2024 11/09/20   [provider]  sevelamer carbonate (RENVELA) 800 MG tablet Take 800 mg by mouth 3 (three) times daily with meals. Patient not taking: Reported on 09/03/2024 07/05/24   [provider]  sodium bicarbonate 650 MG tablet Take 1,300 mg by mouth 2 (two) times daily. Patient not taking: Reported on 09/03/2024 07/05/24   [provider]  Lyle Pesa, NP [facility] 09/07/2024, 10:20 AM  See Amion for pager If no response to pager , please call [phone] until 7pm After 7:00 pm call Elink  [phone]

## 2024-09-07 NOTE — Progress Notes (Signed)
 [facility] NEPHROLOGY PROGRESS NOTE  Assessment/ Plan: Pt is a 78 y.o. 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  Followed by [name] at [facility] and at the [facility].  He has LUE AV fistula created on 11/20/2023. -First HD on 11/11, cannulated AV fistula well.   -Completed 3 HD treatments, tolerated well and clinically much improved.  Outpatient dialysis arranged at [facility], MWF, 10 am chair time, with a tentative start date of Monday the 17th.  - Plan for next HD on Monday which can be done outpatient. -Ok to discharge from renal perspective.  Discussed with the primary team.   # Fluid overload/lower extreme edema: Continue to ultrafiltrate with dialysis, recommend fluid restriction.  Edema is improving well.   # Hypertension: Managing volume with dialysis, holding antihypertensives.   # Anemia of CKD: Patient used to get iron infusion as outpatient.  Iron saturation 15%, serum iron is 20.  Treated with  IV iron and on erythropoietin.    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  Monitor calcium  and phosphorus level.  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 S/p thoracocentesis.   Subjective: Seen and examined.  Walking in the hallway with physical therapist.  He reports feeling much better and eager to go home today.  He understands about outpatient dialysis plan.  No nausea, vomiting, chest pain or shortness of breath.  Objective Vital signs in last 24 hours: Vitals:    09/07/24 0428 09/07/24 0429 09/07/24 0735 09/07/24 0815  BP:  (!) 161/60 (!) 161/66 (!) 151/65  Pulse: 89 88 86 88  Resp: 18 18 18 16   Temp: 98.1 F (36.7 C) 98.1 F (36.7 C) 98.2 F (36.8 C) 98.1 F (36.7 C)  TempSrc:  Oral Oral Oral  SpO2: 94% 96% 94%   Weight:      Height:       Weight change:   Intake/Output Summary (Last 24 hours) at 09/07/2024 1038 Last data filed at 09/07/2024 0817 Gross per 24 hour  Intake 461.47 ml  Output 600 ml  Net -138.53 ml       Labs: RENAL PANEL Recent Labs  Lab 09/03/24 0733 09/03/24 2144 09/04/24 1201 09/04/24 1446 09/05/24 0246 09/06/24 0216 09/06/24 1230  NA 139 141 144  --  138 137  --   K 5.1 4.3 4.7  --  4.3 3.9  --   CL 109 107 109  --  103 100  --   CO2 16* 22 24  --  26 27  --   GLUCOSE 102* 111* 90  --  91 101*  --   BUN 60* 37* 40*  --  25* 13  --   CREATININE 7.58* 5.55* 6.08*  --  4.53* 3.41*  --   CALCIUM  8.3* 8.1* 8.3*  --  7.9* 7.8*  --   MG  --   --   --  2.0  --   --   --   PHOS  --  4.0 4.4  --  3.7 2.8  --   ALBUMIN 2.4* 2.3* 2.2*  --  2.1* 2.0* 2.1*    Liver Function Tests: Recent Labs  Lab 09/03/24 0733 09/03/24 2144 09/05/24 0246 09/06/24 0216 09/06/24 1230  AST 15  --   --   --   --   ALT 9  --   --   --   --   ALKPHOS 47  --   --   --   --   BILITOT 0.5  --   --   --   --   PROT 6.7  --   --   --  6.6  ALBUMIN 2.4*   < > 2.1* 2.0* 2.1*   < > = values in this interval not displayed.   No results for input(s): LIPASE, AMYLASE in the last 168 hours. No results for input(s): AMMONIA in the last 168 hours. CBC: Recent Labs    09/03/24 0733 09/03/24 2144 09/04/24 1201 09/04/24 1446 09/05/24 0246 09/06/24 0216  HGB 8.5* 8.8* 8.6*  --  8.1* 9.2*  MCV 84.8 81.7 84.4  --  82.9 83.2  FERRITIN  --   --   --  576*  --   --   TIBC  --   --   --  132*  --   --   IRON  --   --   --  20*  --   --     Cardiac Enzymes: No results for input(s): CKTOTAL, CKMB, CKMBINDEX, TROPONINI  in the last 168 hours. CBG: Recent Labs  Lab 09/05/24 1101 09/05/24 2103 09/06/24 0823 09/06/24 1207 09/06/24 1604  GLUCAP 99 120* 84 112* 101*    Iron Studies:  Recent Labs    09/04/24 1446  IRON 20*  TIBC 132*  FERRITIN 576*   Studies/Results: DG CHEST PORT 1 VIEW Result Date: 09/06/2024 CLINICAL DATA:  Post thoracentesis. EXAM: PORTABLE CHEST 1 VIEW COMPARISON:  CT 09/04/2024.  Radiographs 09/04/2024 and 09/03/2024. FINDINGS: 1209 hours. Interval decreased size of right pleural effusion with improved aeration of the right lung base. No pneumothorax. Residual right basilar and left upper lobe airspace opacities with grossly stable left lower lobe pulmonary nodule. The heart size and mediastinal contours are stable. No acute osseous findings are evident. IMPRESSION: Interval decreased size of right pleural effusion with improved aeration of the right lung base. No pneumothorax. Electronically Signed   By: Elsie Perone M.D.   On: 09/06/2024 12:45   US  THYROID Result Date: 09/05/2024 CLINICAL DATA:  Incidental on CT. Bilateral thyroid nodules detected by CT of chest. EXAM: THYROID ULTRASOUND TECHNIQUE: Ultrasound examination of the thyroid gland and adjacent soft tissues was performed. COMPARISON:  CT chest 09/04/2024 FINDINGS: Parenchymal Echotexture: Mildly heterogenous Isthmus: 0.2 cm Right lobe: 4.1 x 2.5 x 2.6 cm Left lobe: 2.8 x 1.5 x 1.4 cm _________________________________________________________ Estimated total number of nodules >/= 1 cm: 2 Number of spongiform nodules >/=  2 cm not described below (TR1): 0 Number of mixed cystic and solid nodules >/= 1.5 cm not described below (TR2): 0 _________________________________________________________ Nodule # 1: Location: Right; Mid Maximum size: 2.6 cm; Other 2 dimensions: 1.9 x 1.9 cm Composition: cystic/almost completely cystic (0) Echogenicity: anechoic (0) Shape: not taller-than-wide (0) Margins: smooth (0) Echogenic foci: none  (0) ACR TI-RADS total points: 0. ACR TI-RADS risk category: TR1 (0-1 points). ACR TI-RADS recommendations: This nodule does NOT meet TI-RADS criteria for biopsy or dedicated follow-up. _________________________________________________________ Nodule # 2: Location: Left; Superior Maximum size: 1.5  cm; Other 2 dimensions: 0.8 x 0.9 cm Composition: cystic/almost completely cystic (0) Echogenicity: anechoic (0) Shape: not taller-than-wide (0) Margins: smooth (0) Echogenic foci: none (0) ACR TI-RADS total points: 0. ACR TI-RADS risk category: TR1 (0-1 points). ACR TI-RADS recommendations: This nodule does NOT meet TI-RADS criteria for biopsy or dedicated follow-up. _________________________________________________________ No enlarged or abnormal appearing lymph nodes are identified. IMPRESSION: Bilateral thyroid cysts. Both do not meet criteria for biopsy or follow-up. Electronically Signed   By: Marcey Moan M.D.   On: 09/05/2024 14:22    Medications: Infusions:  iron sucrose Stopped (09/06/24 1850)     Scheduled Medications:  amoxicillin -clavulanate  1 tablet Oral QHS   Chlorhexidine  Gluconate Cloth  6 each Topical Q0600   darbepoetin (ARANESP) injection - DIALYSIS  60 mcg Subcutaneous Q Thu-1800   feeding supplement (NEPRO CARB STEADY)  237 mL Oral BID BM   heparin   5,000 Units Subcutaneous Q8H   hydrocortisone cream   Topical BID   polyethylene glycol  17 g Oral Daily   senna-docusate  1 tablet Oral BID   sodium chloride  flush  3 mL Intravenous Q12H    have reviewed scheduled and prn medications.  Physical Exam: General:NAD, comfortable Heart:RRR, s1s2 nl Lungs:clear b/l, no crackle Abdomen:soft, Non-tender, non-distended Extremities: Bilateral lower extremities pitting edema +, gradually improving. Dialysis Access: Left AV fistula has a good thrill.  Adden Strout Prasad Yaa Donnellan 09/07/2024,10:38 AM  LOS: 4 days

## 2024-09-09 LAB — BODY FLUID CULTURE W GRAM STAIN: Culture: NO GROWTH

## 2024-09-09 LAB — CYTOLOGY - NON PAP

## 2024-09-09 NOTE — Plan of Care (Signed)
 [facility]  Initial Hemodialysis Orders Dialysis center: [facility]  Patient's name: Ernest Walker DOB: [birth year] ESRD  Discharge diagnosis: ESRD, starting HD in the setting of volume overload Lung nodule. Needs repeat CT scan  3.   Pleural effusions   Allergies:  Allergies  Allergen Reactions   Nsaids Other (See Comments)    Contraindication due to CKD    Date of First Dialysis: 09/03/24 Cause of renal disease: DM, HTN   Dialysis Prescription: Dialysis Frequency: three times per week  Tx duration: 4:00  BFR: 400 DFR: 600 EDW: 74 kg  (Challenge if comes in lower)  Dialyzer: 180NRe UF profile/Sodium modeling?: -- Dialysis Bath: 2 K 2 Ca  Dialysis access: Access type: AVF  Date placed: 11/20/23 Surgeon: [facility]  Needle gauge: 15g x 2  In Center Medications: Heparin  Dose: Tight bolus  Type:  VDRA: per protocol  Venofer: per protocol  Mircera: Hold ESA until f/u CT done and malignancy has been ruled out.   Discharge labs: Hgb: 9.2 K+: 3.9        Ca: 7.8  Phos: 2.8 Alb: 2.1  Please draw routine labs. Check full monthly labs on arrival  Additional notes/follow-up:   Maisie Ronnald Acosta PA-C

## 2024-09-09 NOTE — Progress Notes (Signed)
 Late note entry 11/17 0844  D/c over weekend noted. Contacted out-pt HD clinic, [facility], to inform of pt d/c and anticipated arrival for 1st appt to clinic this morning. No further support needed.   Yaslyn Cumby Dialysis Navigator [phone]

## 2024-09-10 NOTE — Telephone Encounter (Signed)
 Mr. Lurz CT is scheduled for Monday, 12/1. Called patient to inform him of the appointment, no answer, left a voicemail. Printed and mailed appointment reminder. Nothing further needed.

## 2024-09-11 LAB — CHOLESTEROL, BODY FLUID: Cholesterol, Fluid: 38 mg/dL

## 2024-09-23 ENCOUNTER — Ambulatory Visit (HOSPITAL_BASED_OUTPATIENT_CLINIC_OR_DEPARTMENT_OTHER)
Admission: RE | Admit: 2024-09-23 | Discharge: 2024-09-23 | Disposition: A | Source: Ambulatory Visit | Attending: Pulmonary Disease

## 2024-09-23 DIAGNOSIS — R918 Other nonspecific abnormal finding of lung field: Secondary | ICD-10-CM | POA: Insufficient documentation

## 2024-10-07 ENCOUNTER — Ambulatory Visit (HOSPITAL_BASED_OUTPATIENT_CLINIC_OR_DEPARTMENT_OTHER): Payer: Self-pay | Admitting: Pulmonary Disease

## 2024-10-08 ENCOUNTER — Inpatient Hospital Stay (HOSPITAL_BASED_OUTPATIENT_CLINIC_OR_DEPARTMENT_OTHER): Admitting: Pulmonary Disease

## 2024-11-19 ENCOUNTER — Encounter (HOSPITAL_BASED_OUTPATIENT_CLINIC_OR_DEPARTMENT_OTHER): Payer: Self-pay | Admitting: Pulmonary Disease

## 2024-11-19 ENCOUNTER — Ambulatory Visit (INDEPENDENT_AMBULATORY_CARE_PROVIDER_SITE_OTHER): Admitting: Pulmonary Disease

## 2024-11-19 VITALS — BP 130/69 | HR 94 | Ht 69.0 in | Wt 146.7 lb

## 2024-11-19 DIAGNOSIS — Z8701 Personal history of pneumonia (recurrent): Secondary | ICD-10-CM

## 2024-11-19 DIAGNOSIS — R911 Solitary pulmonary nodule: Secondary | ICD-10-CM

## 2024-11-19 DIAGNOSIS — Z87891 Personal history of nicotine dependence: Secondary | ICD-10-CM | POA: Diagnosis not present

## 2024-11-19 DIAGNOSIS — R9389 Abnormal findings on diagnostic imaging of other specified body structures: Secondary | ICD-10-CM

## 2024-11-19 DIAGNOSIS — J90 Pleural effusion, not elsewhere classified: Secondary | ICD-10-CM

## 2024-11-19 NOTE — Patient Instructions (Signed)
 Follow up as needed

## 2024-11-19 NOTE — Progress Notes (Signed)
 "   Subjective:   PATIENT ID: Ernest Walker GENDER: male DOB: 19-Sep-1946, MRN: [id number]  Chief Complaint  Patient presents with   Lung Mass    Lung nodule CT scan follow-up    Reason for Visit: Hospital follow-up      BERTIL BRICKEY is a 79 y.o. male with ESRD, HTN, DM2 who presented for hospital follow-up. PCCM consulted for left upper lobe lesion on CT scan in 09/2024.    11/19/2024 Discussed the use of AI scribe software for clinical note transcription with the patient, who gave verbal consent to proceed.  History of Present Illness Ernest Walker is an 79 year old male with end stage renal disease who presents for hospital follow-up after treatment for hypervolemia. He is accompanied by his wife.  He was hospitalized from November 11 to September 07, 2024, for hypervolemia requiring dialysis. During this admission, he underwent a thoracentesis on September 06, 2024, with one liter of pink-tinged transudate fluid removed. Labs were consistent with transudate. He feels significantly better after the fluid removal, noting improved breathing and the ability to wear his shoes comfortably again since his lower extremity edema has resolved. He continues regular dialysis sessions. He notes that there is still small pleural effusion present but it is significantly less than before his recent hospitalization.  While inpatient pulmonary consultation was sought for a left upper lobe nodule measuring 7.1 by 5.3 centimeters concerning for adenocarcinoma. Follow-up CT scan was ordered for 09/23/25 however patient no showed on follow-up clinic visit. Message sent with results of resolved LUL lesion. Also left lower lobe nodule measuring 2.4 by 1.7 cm was noted, which had been previously identified as non-hypermetabolic on a PET CT from Mar 18, 2009.  He has a persistent dry cough, which he describes as typical for him. He reports a persistent dry cough and denies new coughing or wheezing since his  discharge from the hospital.     Past Medical History:  Diagnosis Date   Arthritis    hands, knees   Carpal tunnel syndrome of right wrist    Diabetes mellitus without complication (HCC)    Hypertension    Prostate cancer (HCC) 2007   radiation implant done at [facility] in [facility]     Family History  Family history unknown: Yes     Social History   Occupational History   Not on file  Tobacco Use   Smoking status: Former   Smokeless tobacco: Never  Vaping Use   Vaping status: Never Used  Substance and Sexual Activity   Alcohol use: No   Drug use: No   Sexual activity: Not on file    Allergies[1]   Outpatient Medications Prior to Visit  Medication Sig Dispense Refill   dorzolamide-timolol (COSOPT) 2-0.5 % ophthalmic solution Apply 1 drop to eye 2 (two) times daily.     hydrocortisone  cream 1 % Apply topically 2 (two) times daily. 28 g 0   insulin  glargine-yfgn (SEMGLEE) 100 UNIT/ML Pen Inject 10 Units into the skin daily.     amLODipine  (NORVASC ) 10 MG tablet Take 10 mg by mouth daily. (Patient not taking: Reported on 11/19/2024)     amoxicillin -clavulanate (AUGMENTIN ) 500-125 MG tablet Take 1 tablet by mouth at bedtime. (Patient not taking: Reported on 11/19/2024) 8 tablet 0   aspirin 81 MG chewable tablet CHEW ONE TABLET BY MOUTH EVERY DAY ASK YOUR DOCTOR HOW LONG YOU SHOULD TAKE THIS MEDICATION (Patient not taking: Reported on 11/19/2024)  atorvastatin  (LIPITOR) 80 MG tablet TAKE ONE-HALF TABLET BY MOUTH AT BEDTIME FOR CHOLESTEROL (Patient not taking: Reported on 11/19/2024)     empagliflozin (JARDIANCE) 25 MG TABS tablet Take 0.5 tablets by mouth daily. (Patient not taking: Reported on 11/19/2024)     lisinopril (ZESTRIL) 40 MG tablet TAKE ONE-HALF TABLET BY MOUTH EVERY DAY FOR BLOOD PRESSURE (Patient not taking: Reported on 11/19/2024)     terazosin  (HYTRIN ) 10 MG capsule Take 1 capsule by mouth at bedtime. (Patient not taking: Reported on 11/19/2024)     No  facility-administered medications prior to visit.    Review of Systems  Constitutional:  Negative for chills, diaphoresis, fever, malaise/fatigue and weight loss.  HENT:  Negative for congestion.   Respiratory:  Positive for cough. Negative for hemoptysis, sputum production, shortness of breath and wheezing.   Cardiovascular:  Negative for chest pain, palpitations and leg swelling.     Objective:   Vitals:   11/19/24 1042  BP: 130/69  Pulse: 94  SpO2: 100%  Weight: 146 lb 11.2 oz (66.5 kg)  Height: 5' 9 (1.753 m)   SpO2: 100 %  Physical Exam GENERAL: Well appearing, no acute distress HEAD EARS NOSE THROAT: Normocephalic, atraumatic EYES: Extraocular movements intact, no scleral icterus RESPIRATORY: Clear to auscultation bilaterally, no crackles, wheezing or rales CARDIOVASCULAR: Regular rate and rhythm, no murmurs, rubs, or gallops, no jugular venous distention EXTREMITIES: No edema, no tenderness NEUROLOGICAL: Alert and oriented x4, cranial nerves II-XII grossly intact PSYCHIATRIC: Normal mood, normal affect   Data Reviewed:  Imaging: CT Chest 09/04/24 - 7.1 x 5.3 cm masslike parahilar lesion in left upper lobe, 2.4 x 1.7 cm LLL pulmonary nodule with no activity on PET/CT 03/18/09, RML and RLL collapse/consolidation CT Chest 09/23/24 - stable pleural based nodule on left hemidiaphragm, small right pleural effusion, small pericardial effusion  PFT: None on file  Labs: CBC    Component Value Date/Time   WBC 8.8 09/06/2024 0216   RBC 3.51 (L) 09/06/2024 0216   HGB 9.2 (L) 09/06/2024 0216   HGB 8.4 (L) 01/06/2023 1255   HCT 29.2 (L) 09/06/2024 0216   HCT 28.8 (L) 01/06/2023 1255   PLT 220 09/06/2024 0216   PLT 350 01/06/2023 1255   MCV 83.2 09/06/2024 0216   MCV 82 01/06/2023 1255   MCH 26.2 09/06/2024 0216   MCHC 31.5 09/06/2024 0216   RDW 15.8 (H) 09/06/2024 0216   RDW 14.6 01/06/2023 1255   LYMPHSABS 2.4 01/06/2023 1255   MONOABS 0.7 11/30/2020 1006    EOSABS 0.3 01/06/2023 1255   BASOSABS 0.1 01/06/2023 1255       Assessment & Plan:   Discussion: HPI  Assessment and Plan Assessment & Plan Follow-up of abnormal chest CT findings Recent CT showed resolution of left upper lobe nodule, indicating pneumonia rather than malignancy. Left lower lobe nodule unchanged, not concerning for malignancy. - No further procedures needed for lung nodules.  Resolved left upper lobe pneumonia Left upper lobe nodule resolved on CT, indicating resolution of pneumonia. - No further intervention required.  Pleural effusion secondary to fluid overload Pleural effusion secondary to fluid overload, previously requiring thoracentesis. Fluid levels decreased with dialysis, some residual fluid remains. - Continue dialysis as scheduled. - Monitor fluid levels; consider thoracentesis if fluid overload becomes extreme.  Stable left lower lobe lung nodule Left lower lobe nodule unchanged, consistent with previous findings, not concerning for malignancy. - No further intervention required.   Health Maintenance Immunization History  Administered Date(s) Administered  Fluad Quad(high Dose 65+) 11/04/2020, 08/30/2021   Fluad Trivalent(High Dose 65+) 10/20/2017, 09/05/2018   H1N1 09/25/2008   INFLUENZA, HIGH DOSE SEASONAL PF 12/21/2015, 07/26/2016   Influenza, Seasonal, Injecte, Preservative Fre 09/22/2009, 07/27/2010, 09/06/2011, 11/22/2012, 08/09/2013   Influenza-Unspecified 08/23/2006, 07/23/2007, 08/15/2008, 09/24/2019, 11/28/2022   PFIZER Comirnaty(Gray Top)Covid-19 Tri-Sucrose Vaccine 04/28/2021   PFIZER(Purple Top)SARS-COV-2 Vaccination 12/21/2019, 01/11/2020   Pfizer Covid-19 Vaccine Bivalent Booster 23yrs & up 08/30/2021   Pfizer(Comirnaty)Fall Seasonal Vaccine 12 years and older 11/28/2022   Pneumococcal Conjugate-13 12/21/2015   Pneumococcal Polysaccharide-23 07/19/2006, 03/21/2012   Td 04/28/2021   Tdap 09/06/2011   Zoster  Recombinant(Shingrix) 12/26/2018, 06/19/2019   Zoster, Live 01/23/2013   CT Lung Screen - not qualified  No orders of the defined types were placed in this encounter. No orders of the defined types were placed in this encounter.   Return if symptoms worsen or fail to improve.  I have spent a total time of 30-minutes on the day of the appointment reviewing prior documentation, coordinating care and discussing medical diagnosis and plan with the patient/family. Imaging, labs and tests included in this note have been reviewed and interpreted independently by me. This note is generated using [employer] programming. Patient/family has given consent.  Precious Segall Slater Staff, MD [facility] 11/19/2024 11:11 AM        [1]  Allergies Allergen Reactions   Nsaids Other (See Comments)    Contraindication due to CKD    "
